# Patient Record
Sex: Male | Born: 1985 | Race: White | Hispanic: No | Marital: Married | Smoking: Never smoker
Health system: Southern US, Community
[De-identification: ages and names within clinical notes are randomized; demographics above are authoritative.]

## PROBLEM LIST (undated history)

## (undated) DIAGNOSIS — E669 Obesity, unspecified: Secondary | ICD-10-CM

## (undated) DIAGNOSIS — J45909 Unspecified asthma, uncomplicated: Secondary | ICD-10-CM

## (undated) DIAGNOSIS — T7840XA Allergy, unspecified, initial encounter: Secondary | ICD-10-CM

## (undated) DIAGNOSIS — G4730 Sleep apnea, unspecified: Secondary | ICD-10-CM

## (undated) DIAGNOSIS — E119 Type 2 diabetes mellitus without complications: Secondary | ICD-10-CM

## (undated) DIAGNOSIS — K209 Esophagitis, unspecified without bleeding: Secondary | ICD-10-CM

## (undated) HISTORY — DX: Obesity, unspecified: E66.9

## (undated) HISTORY — DX: Unspecified asthma, uncomplicated: J45.909

## (undated) HISTORY — DX: Allergy, unspecified, initial encounter: T78.40XA

## (undated) HISTORY — PX: SEPTOPLASTY: SUR1290

## (undated) HISTORY — DX: Sleep apnea, unspecified: G47.30

## (undated) HISTORY — PX: OTHER SURGICAL HISTORY: SHX169

## (undated) HISTORY — DX: Type 2 diabetes mellitus without complications: E11.9

---

## 2010-02-02 ENCOUNTER — Emergency Department (HOSPITAL_COMMUNITY)
Admission: EM | Admit: 2010-02-02 | Discharge: 2010-02-02 | Payer: Self-pay | Source: Home / Self Care | Admitting: Emergency Medicine

## 2010-04-17 LAB — BASIC METABOLIC PANEL
Calcium: 9.5 mg/dL (ref 8.4–10.5)
Creatinine, Ser: 1.07 mg/dL (ref 0.4–1.5)
GFR calc Af Amer: >60 mL/min (ref >60)
Glucose, Bld: 116 mg/dL — ABNORMAL HIGH (ref 70–99)
Sodium: 136 mEq/L (ref 135–145)

## 2010-04-17 LAB — URINALYSIS, ROUTINE W REFLEX MICROSCOPIC: pH: 6.5 (ref 5.0–8.0)

## 2010-04-17 LAB — CBC
HCT: 47.2 % (ref 39.0–52.0)
MCV: 85.4 fL (ref 78.0–100.0)
WBC: 14.3 10*3/uL — ABNORMAL HIGH (ref 4.0–10.5)

## 2010-04-17 LAB — URINE MICROSCOPIC-ADD ON

## 2010-04-17 LAB — DIFFERENTIAL
Basophils Absolute: 0 10*3/uL (ref 0.0–0.1)
Basophils Relative: 0 % (ref 0–1)
Monocytes Absolute: 1 10*3/uL (ref 0.1–1.0)
Monocytes Relative: 7 % (ref 3–12)
Neutro Abs: 12.3 10*3/uL — ABNORMAL HIGH (ref 1.7–7.7)
Neutrophils Relative %: 86 % — ABNORMAL HIGH (ref 43–77)

## 2017-02-13 DIAGNOSIS — J3089 Other allergic rhinitis: Secondary | ICD-10-CM | POA: Diagnosis not present

## 2017-02-13 DIAGNOSIS — R05 Cough: Secondary | ICD-10-CM | POA: Diagnosis not present

## 2017-02-13 DIAGNOSIS — J301 Allergic rhinitis due to pollen: Secondary | ICD-10-CM | POA: Diagnosis not present

## 2017-02-13 DIAGNOSIS — J3081 Allergic rhinitis due to animal (cat) (dog) hair and dander: Secondary | ICD-10-CM | POA: Diagnosis not present

## 2017-02-21 DIAGNOSIS — J301 Allergic rhinitis due to pollen: Secondary | ICD-10-CM | POA: Diagnosis not present

## 2017-02-22 DIAGNOSIS — J3089 Other allergic rhinitis: Secondary | ICD-10-CM | POA: Diagnosis not present

## 2017-02-22 DIAGNOSIS — J3081 Allergic rhinitis due to animal (cat) (dog) hair and dander: Secondary | ICD-10-CM | POA: Diagnosis not present

## 2017-03-12 DIAGNOSIS — J3081 Allergic rhinitis due to animal (cat) (dog) hair and dander: Secondary | ICD-10-CM | POA: Diagnosis not present

## 2017-03-12 DIAGNOSIS — J301 Allergic rhinitis due to pollen: Secondary | ICD-10-CM | POA: Diagnosis not present

## 2017-03-12 DIAGNOSIS — J3089 Other allergic rhinitis: Secondary | ICD-10-CM | POA: Diagnosis not present

## 2017-03-14 DIAGNOSIS — J3089 Other allergic rhinitis: Secondary | ICD-10-CM | POA: Diagnosis not present

## 2017-03-14 DIAGNOSIS — J301 Allergic rhinitis due to pollen: Secondary | ICD-10-CM | POA: Diagnosis not present

## 2017-03-14 DIAGNOSIS — J3081 Allergic rhinitis due to animal (cat) (dog) hair and dander: Secondary | ICD-10-CM | POA: Diagnosis not present

## 2017-03-18 DIAGNOSIS — J3089 Other allergic rhinitis: Secondary | ICD-10-CM | POA: Diagnosis not present

## 2017-03-18 DIAGNOSIS — J301 Allergic rhinitis due to pollen: Secondary | ICD-10-CM | POA: Diagnosis not present

## 2017-03-18 DIAGNOSIS — J3081 Allergic rhinitis due to animal (cat) (dog) hair and dander: Secondary | ICD-10-CM | POA: Diagnosis not present

## 2017-03-29 DIAGNOSIS — J3089 Other allergic rhinitis: Secondary | ICD-10-CM | POA: Diagnosis not present

## 2017-03-29 DIAGNOSIS — J3081 Allergic rhinitis due to animal (cat) (dog) hair and dander: Secondary | ICD-10-CM | POA: Diagnosis not present

## 2017-03-29 DIAGNOSIS — J301 Allergic rhinitis due to pollen: Secondary | ICD-10-CM | POA: Diagnosis not present

## 2017-04-01 DIAGNOSIS — J3081 Allergic rhinitis due to animal (cat) (dog) hair and dander: Secondary | ICD-10-CM | POA: Diagnosis not present

## 2017-04-01 DIAGNOSIS — J301 Allergic rhinitis due to pollen: Secondary | ICD-10-CM | POA: Diagnosis not present

## 2017-04-01 DIAGNOSIS — J3089 Other allergic rhinitis: Secondary | ICD-10-CM | POA: Diagnosis not present

## 2017-04-04 DIAGNOSIS — J3081 Allergic rhinitis due to animal (cat) (dog) hair and dander: Secondary | ICD-10-CM | POA: Diagnosis not present

## 2017-04-04 DIAGNOSIS — J3089 Other allergic rhinitis: Secondary | ICD-10-CM | POA: Diagnosis not present

## 2017-04-04 DIAGNOSIS — J301 Allergic rhinitis due to pollen: Secondary | ICD-10-CM | POA: Diagnosis not present

## 2017-04-09 DIAGNOSIS — J3081 Allergic rhinitis due to animal (cat) (dog) hair and dander: Secondary | ICD-10-CM | POA: Diagnosis not present

## 2017-04-09 DIAGNOSIS — J3089 Other allergic rhinitis: Secondary | ICD-10-CM | POA: Diagnosis not present

## 2017-04-09 DIAGNOSIS — J301 Allergic rhinitis due to pollen: Secondary | ICD-10-CM | POA: Diagnosis not present

## 2017-04-12 DIAGNOSIS — J3089 Other allergic rhinitis: Secondary | ICD-10-CM | POA: Diagnosis not present

## 2017-04-12 DIAGNOSIS — J301 Allergic rhinitis due to pollen: Secondary | ICD-10-CM | POA: Diagnosis not present

## 2017-04-12 DIAGNOSIS — J3081 Allergic rhinitis due to animal (cat) (dog) hair and dander: Secondary | ICD-10-CM | POA: Diagnosis not present

## 2017-04-16 DIAGNOSIS — J3089 Other allergic rhinitis: Secondary | ICD-10-CM | POA: Diagnosis not present

## 2017-04-16 DIAGNOSIS — J301 Allergic rhinitis due to pollen: Secondary | ICD-10-CM | POA: Diagnosis not present

## 2017-04-16 DIAGNOSIS — J3081 Allergic rhinitis due to animal (cat) (dog) hair and dander: Secondary | ICD-10-CM | POA: Diagnosis not present

## 2017-04-18 DIAGNOSIS — J301 Allergic rhinitis due to pollen: Secondary | ICD-10-CM | POA: Diagnosis not present

## 2017-04-18 DIAGNOSIS — J3081 Allergic rhinitis due to animal (cat) (dog) hair and dander: Secondary | ICD-10-CM | POA: Diagnosis not present

## 2017-04-18 DIAGNOSIS — J3089 Other allergic rhinitis: Secondary | ICD-10-CM | POA: Diagnosis not present

## 2017-04-22 DIAGNOSIS — J301 Allergic rhinitis due to pollen: Secondary | ICD-10-CM | POA: Diagnosis not present

## 2017-04-22 DIAGNOSIS — J3089 Other allergic rhinitis: Secondary | ICD-10-CM | POA: Diagnosis not present

## 2017-04-22 DIAGNOSIS — J3081 Allergic rhinitis due to animal (cat) (dog) hair and dander: Secondary | ICD-10-CM | POA: Diagnosis not present

## 2017-04-26 DIAGNOSIS — J3081 Allergic rhinitis due to animal (cat) (dog) hair and dander: Secondary | ICD-10-CM | POA: Diagnosis not present

## 2017-04-26 DIAGNOSIS — J301 Allergic rhinitis due to pollen: Secondary | ICD-10-CM | POA: Diagnosis not present

## 2017-04-26 DIAGNOSIS — J3089 Other allergic rhinitis: Secondary | ICD-10-CM | POA: Diagnosis not present

## 2017-04-29 DIAGNOSIS — J3089 Other allergic rhinitis: Secondary | ICD-10-CM | POA: Diagnosis not present

## 2017-04-29 DIAGNOSIS — J301 Allergic rhinitis due to pollen: Secondary | ICD-10-CM | POA: Diagnosis not present

## 2017-04-29 DIAGNOSIS — J3081 Allergic rhinitis due to animal (cat) (dog) hair and dander: Secondary | ICD-10-CM | POA: Diagnosis not present

## 2017-05-03 DIAGNOSIS — J3081 Allergic rhinitis due to animal (cat) (dog) hair and dander: Secondary | ICD-10-CM | POA: Diagnosis not present

## 2017-05-03 DIAGNOSIS — J3089 Other allergic rhinitis: Secondary | ICD-10-CM | POA: Diagnosis not present

## 2017-05-03 DIAGNOSIS — J301 Allergic rhinitis due to pollen: Secondary | ICD-10-CM | POA: Diagnosis not present

## 2017-05-07 DIAGNOSIS — J3081 Allergic rhinitis due to animal (cat) (dog) hair and dander: Secondary | ICD-10-CM | POA: Diagnosis not present

## 2017-05-07 DIAGNOSIS — J3089 Other allergic rhinitis: Secondary | ICD-10-CM | POA: Diagnosis not present

## 2017-05-07 DIAGNOSIS — J301 Allergic rhinitis due to pollen: Secondary | ICD-10-CM | POA: Diagnosis not present

## 2017-05-10 DIAGNOSIS — J3081 Allergic rhinitis due to animal (cat) (dog) hair and dander: Secondary | ICD-10-CM | POA: Diagnosis not present

## 2017-05-10 DIAGNOSIS — J301 Allergic rhinitis due to pollen: Secondary | ICD-10-CM | POA: Diagnosis not present

## 2017-05-10 DIAGNOSIS — J3089 Other allergic rhinitis: Secondary | ICD-10-CM | POA: Diagnosis not present

## 2017-05-13 DIAGNOSIS — J301 Allergic rhinitis due to pollen: Secondary | ICD-10-CM | POA: Diagnosis not present

## 2017-05-13 DIAGNOSIS — J3081 Allergic rhinitis due to animal (cat) (dog) hair and dander: Secondary | ICD-10-CM | POA: Diagnosis not present

## 2017-05-13 DIAGNOSIS — J3089 Other allergic rhinitis: Secondary | ICD-10-CM | POA: Diagnosis not present

## 2017-05-17 DIAGNOSIS — J3081 Allergic rhinitis due to animal (cat) (dog) hair and dander: Secondary | ICD-10-CM | POA: Diagnosis not present

## 2017-05-17 DIAGNOSIS — J3089 Other allergic rhinitis: Secondary | ICD-10-CM | POA: Diagnosis not present

## 2017-05-17 DIAGNOSIS — J301 Allergic rhinitis due to pollen: Secondary | ICD-10-CM | POA: Diagnosis not present

## 2017-05-20 DIAGNOSIS — J3081 Allergic rhinitis due to animal (cat) (dog) hair and dander: Secondary | ICD-10-CM | POA: Diagnosis not present

## 2017-05-20 DIAGNOSIS — J301 Allergic rhinitis due to pollen: Secondary | ICD-10-CM | POA: Diagnosis not present

## 2017-05-20 DIAGNOSIS — J3089 Other allergic rhinitis: Secondary | ICD-10-CM | POA: Diagnosis not present

## 2017-05-22 DIAGNOSIS — J3081 Allergic rhinitis due to animal (cat) (dog) hair and dander: Secondary | ICD-10-CM | POA: Diagnosis not present

## 2017-05-22 DIAGNOSIS — J301 Allergic rhinitis due to pollen: Secondary | ICD-10-CM | POA: Diagnosis not present

## 2017-05-22 DIAGNOSIS — J3089 Other allergic rhinitis: Secondary | ICD-10-CM | POA: Diagnosis not present

## 2017-05-27 DIAGNOSIS — J3081 Allergic rhinitis due to animal (cat) (dog) hair and dander: Secondary | ICD-10-CM | POA: Diagnosis not present

## 2017-05-27 DIAGNOSIS — J301 Allergic rhinitis due to pollen: Secondary | ICD-10-CM | POA: Diagnosis not present

## 2017-05-27 DIAGNOSIS — J3089 Other allergic rhinitis: Secondary | ICD-10-CM | POA: Diagnosis not present

## 2017-05-31 DIAGNOSIS — J301 Allergic rhinitis due to pollen: Secondary | ICD-10-CM | POA: Diagnosis not present

## 2017-05-31 DIAGNOSIS — J3081 Allergic rhinitis due to animal (cat) (dog) hair and dander: Secondary | ICD-10-CM | POA: Diagnosis not present

## 2017-05-31 DIAGNOSIS — J3089 Other allergic rhinitis: Secondary | ICD-10-CM | POA: Diagnosis not present

## 2017-06-03 DIAGNOSIS — J301 Allergic rhinitis due to pollen: Secondary | ICD-10-CM | POA: Diagnosis not present

## 2017-06-03 DIAGNOSIS — J3089 Other allergic rhinitis: Secondary | ICD-10-CM | POA: Diagnosis not present

## 2017-06-03 DIAGNOSIS — J3081 Allergic rhinitis due to animal (cat) (dog) hair and dander: Secondary | ICD-10-CM | POA: Diagnosis not present

## 2017-06-06 DIAGNOSIS — J3081 Allergic rhinitis due to animal (cat) (dog) hair and dander: Secondary | ICD-10-CM | POA: Diagnosis not present

## 2017-06-06 DIAGNOSIS — J3089 Other allergic rhinitis: Secondary | ICD-10-CM | POA: Diagnosis not present

## 2017-06-06 DIAGNOSIS — J301 Allergic rhinitis due to pollen: Secondary | ICD-10-CM | POA: Diagnosis not present

## 2017-07-16 DIAGNOSIS — J3081 Allergic rhinitis due to animal (cat) (dog) hair and dander: Secondary | ICD-10-CM | POA: Diagnosis not present

## 2017-07-16 DIAGNOSIS — J3089 Other allergic rhinitis: Secondary | ICD-10-CM | POA: Diagnosis not present

## 2017-07-16 DIAGNOSIS — H1045 Other chronic allergic conjunctivitis: Secondary | ICD-10-CM | POA: Diagnosis not present

## 2017-07-16 DIAGNOSIS — J301 Allergic rhinitis due to pollen: Secondary | ICD-10-CM | POA: Diagnosis not present

## 2017-07-17 DIAGNOSIS — J3081 Allergic rhinitis due to animal (cat) (dog) hair and dander: Secondary | ICD-10-CM | POA: Diagnosis not present

## 2017-07-17 DIAGNOSIS — J3089 Other allergic rhinitis: Secondary | ICD-10-CM | POA: Diagnosis not present

## 2017-12-17 DIAGNOSIS — R0681 Apnea, not elsewhere classified: Secondary | ICD-10-CM | POA: Diagnosis not present

## 2017-12-17 DIAGNOSIS — J309 Allergic rhinitis, unspecified: Secondary | ICD-10-CM | POA: Diagnosis not present

## 2017-12-17 DIAGNOSIS — R1310 Dysphagia, unspecified: Secondary | ICD-10-CM | POA: Diagnosis not present

## 2017-12-27 DIAGNOSIS — R1310 Dysphagia, unspecified: Secondary | ICD-10-CM | POA: Diagnosis not present

## 2017-12-27 DIAGNOSIS — E669 Obesity, unspecified: Secondary | ICD-10-CM | POA: Diagnosis not present

## 2018-01-14 DIAGNOSIS — J310 Chronic rhinitis: Secondary | ICD-10-CM | POA: Diagnosis not present

## 2018-01-14 DIAGNOSIS — J343 Hypertrophy of nasal turbinates: Secondary | ICD-10-CM | POA: Diagnosis not present

## 2018-01-14 DIAGNOSIS — J342 Deviated nasal septum: Secondary | ICD-10-CM | POA: Diagnosis not present

## 2018-01-22 DIAGNOSIS — J3081 Allergic rhinitis due to animal (cat) (dog) hair and dander: Secondary | ICD-10-CM | POA: Diagnosis not present

## 2018-01-22 DIAGNOSIS — J301 Allergic rhinitis due to pollen: Secondary | ICD-10-CM | POA: Diagnosis not present

## 2018-01-22 DIAGNOSIS — J3089 Other allergic rhinitis: Secondary | ICD-10-CM | POA: Diagnosis not present

## 2018-01-22 DIAGNOSIS — H1045 Other chronic allergic conjunctivitis: Secondary | ICD-10-CM | POA: Diagnosis not present

## 2018-01-23 DIAGNOSIS — J3089 Other allergic rhinitis: Secondary | ICD-10-CM | POA: Diagnosis not present

## 2018-01-23 DIAGNOSIS — J3081 Allergic rhinitis due to animal (cat) (dog) hair and dander: Secondary | ICD-10-CM | POA: Diagnosis not present

## 2018-02-13 DIAGNOSIS — K222 Esophageal obstruction: Secondary | ICD-10-CM | POA: Diagnosis not present

## 2018-02-13 DIAGNOSIS — R1310 Dysphagia, unspecified: Secondary | ICD-10-CM | POA: Diagnosis not present

## 2018-02-13 DIAGNOSIS — R1314 Dysphagia, pharyngoesophageal phase: Secondary | ICD-10-CM | POA: Diagnosis not present

## 2018-02-13 DIAGNOSIS — K200 Eosinophilic esophagitis: Secondary | ICD-10-CM | POA: Diagnosis not present

## 2018-02-13 DIAGNOSIS — K2930 Chronic superficial gastritis without bleeding: Secondary | ICD-10-CM | POA: Diagnosis not present

## 2018-02-19 DIAGNOSIS — K2930 Chronic superficial gastritis without bleeding: Secondary | ICD-10-CM | POA: Diagnosis not present

## 2018-02-19 DIAGNOSIS — K200 Eosinophilic esophagitis: Secondary | ICD-10-CM | POA: Diagnosis not present

## 2018-02-26 DIAGNOSIS — J343 Hypertrophy of nasal turbinates: Secondary | ICD-10-CM | POA: Diagnosis not present

## 2018-02-26 DIAGNOSIS — J342 Deviated nasal septum: Secondary | ICD-10-CM | POA: Diagnosis not present

## 2018-02-26 DIAGNOSIS — J310 Chronic rhinitis: Secondary | ICD-10-CM | POA: Diagnosis not present

## 2018-03-04 DIAGNOSIS — J309 Allergic rhinitis, unspecified: Secondary | ICD-10-CM | POA: Diagnosis not present

## 2018-03-04 DIAGNOSIS — J342 Deviated nasal septum: Secondary | ICD-10-CM | POA: Diagnosis not present

## 2018-03-04 DIAGNOSIS — G4730 Sleep apnea, unspecified: Secondary | ICD-10-CM | POA: Diagnosis not present

## 2018-03-04 DIAGNOSIS — J343 Hypertrophy of nasal turbinates: Secondary | ICD-10-CM | POA: Diagnosis not present

## 2018-03-07 DIAGNOSIS — J3489 Other specified disorders of nose and nasal sinuses: Secondary | ICD-10-CM | POA: Diagnosis not present

## 2018-03-07 DIAGNOSIS — J342 Deviated nasal septum: Secondary | ICD-10-CM | POA: Diagnosis not present

## 2018-03-07 DIAGNOSIS — J343 Hypertrophy of nasal turbinates: Secondary | ICD-10-CM | POA: Diagnosis not present

## 2018-05-22 DIAGNOSIS — K200 Eosinophilic esophagitis: Secondary | ICD-10-CM | POA: Diagnosis not present

## 2018-05-22 DIAGNOSIS — R1310 Dysphagia, unspecified: Secondary | ICD-10-CM | POA: Diagnosis not present

## 2018-10-20 ENCOUNTER — Emergency Department (HOSPITAL_COMMUNITY)
Admission: EM | Admit: 2018-10-20 | Discharge: 2018-10-20 | Disposition: A | Discharge status: Home / Self Care | Payer: BC Managed Care – PPO | Attending: Emergency Medicine | Admitting: Emergency Medicine

## 2018-10-20 ENCOUNTER — Encounter (HOSPITAL_COMMUNITY): Payer: Self-pay | Admitting: Emergency Medicine

## 2018-10-20 ENCOUNTER — Other Ambulatory Visit: Payer: Self-pay

## 2018-10-20 ENCOUNTER — Emergency Department (HOSPITAL_COMMUNITY): Payer: BC Managed Care – PPO

## 2018-10-20 DIAGNOSIS — R0789 Other chest pain: Secondary | ICD-10-CM | POA: Diagnosis not present

## 2018-10-20 DIAGNOSIS — R079 Chest pain, unspecified: Secondary | ICD-10-CM | POA: Diagnosis not present

## 2018-10-20 HISTORY — DX: Esophagitis, unspecified without bleeding: K20.90

## 2018-10-20 LAB — CBC
HCT: 46.9 % (ref 39.0–52.0)
Hemoglobin: 15.7 g/dL (ref 13.0–17.0)
MCH: 29 pg (ref 26.0–34.0)
MCHC: 33.5 g/dL (ref 30.0–36.0)
MCV: 86.7 fL (ref 80.0–100.0)
Platelets: 200 10*3/uL (ref 150–400)
RBC: 5.41 MIL/uL (ref 4.22–5.81)
RDW: 13.4 % (ref 11.5–15.5)
WBC: 10.4 10*3/uL (ref 4.0–10.5)
nRBC: 0 % (ref 0.0–0.2)

## 2018-10-20 LAB — BASIC METABOLIC PANEL
Anion gap: 9 (ref 5–15)
BUN: 12 mg/dL (ref 6–20)
CO2: 23 mmol/L (ref 22–32)
Calcium: 9.5 mg/dL (ref 8.9–10.3)
Chloride: 107 mmol/L (ref 98–111)
Creatinine, Ser: 0.93 mg/dL (ref 0.61–1.24)
GFR calc Af Amer: >60 mL/min (ref >60)
GFR calc non Af Amer: >60 mL/min (ref >60)
Glucose, Bld: 83 mg/dL (ref 70–99)
Potassium: 4.4 mmol/L (ref 3.5–5.1)
Sodium: 139 mmol/L (ref 135–145)

## 2018-10-20 LAB — D-DIMER, QUANTITATIVE: D-Dimer, Quant: <0.27 ug/mL-FEU (ref 0.00–0.50)

## 2018-10-20 LAB — TROPONIN I (HIGH SENSITIVITY): Troponin I (High Sensitivity): 2 ng/L (ref <18)

## 2018-10-20 MED ORDER — SODIUM CHLORIDE 0.9% FLUSH: 3.0000 mL | Freq: Once | INTRAVENOUS | Status: DC

## 2018-10-20 NOTE — ED Triage Notes (Signed)
Patient c/o mid chest pain x a few seconds approx one hour ago. Patient states he feels like he can not get a deep breath since having the chest pain. Patient states he was just sitting at his desk working.

## 2018-10-20 NOTE — Discharge Instructions (Signed)
Return to the ED if you start to have worsening symptoms, worsening chest pain, leg swelling, shortness of breath, vomiting or coughing up blood.

## 2018-10-20 NOTE — ED Provider Notes (Signed)
[facility]-EMERGENCY DEPT Provider Note   CSN: [id number] Arrival date & time: 10/20/18  1335     History   Chief Complaint Chief Complaint  Patient presents with   Chest Pain    HPI Jared Blake is a 33 y.o. male with past medical history of esophagitis presents to ED for chest pressure.  States that he was sitting at his desk at work earlier today when he had acute onset of pain to the center of his chest.  States that "it felt like I got the wind knocked out of me and someone punched me."  States that the episode lasted for several seconds and resolved on its own.  States he no longer has the pain but feels like "the pressure gets worse when I taken a deep breath."  No history of similar symptoms in the past.  Denies any cough, shortness of breath, fever, hemoptysis, leg swelling.  He denies any family history of heart disease at a young age, history of DVT, PE, MI, recent immobilization, abdominal pain, vomiting, changes to activity or appetite, injury or fall or URI symptoms.     HPI  Past Medical History:  Diagnosis Date   Esophagitis     There are no active problems to display for this patient.   History reviewed. No pertinent surgical history.      Home Medications    Prior to Admission medications   Medication Sig Start Date End Date Taking? Authorizing Provider  cetirizine (ZYRTEC) 10 MG tablet Take 10 mg by mouth daily.   Yes [provider]    Family History No family history on file.  Social History Social History   Tobacco Use   Smoking status: Never Smoker   Smokeless tobacco: Never Used  Substance Use Topics   Alcohol use: Yes    Comment: social   Drug use: Never     Allergies   Patient has no known allergies.   Review of Systems Review of Systems  Constitutional: Negative for appetite change, chills and fever.  HENT: Negative for ear pain, rhinorrhea, sneezing and sore throat.   Eyes: Negative  for photophobia and visual disturbance.  Respiratory: Negative for cough, chest tightness, shortness of breath and wheezing.   Cardiovascular: Positive for chest pain. Negative for palpitations.  Gastrointestinal: Negative for abdominal pain, blood in stool, constipation, diarrhea, nausea and vomiting.  Genitourinary: Negative for dysuria, hematuria and urgency.  Musculoskeletal: Negative for myalgias.  Skin: Negative for rash.  Neurological: Negative for dizziness, weakness and light-headedness.     Physical Exam Updated Vital Signs BP 131/73    Pulse 66    Temp 98.5 F (36.9 C) (Oral)    Resp 17    Ht 6\' 3"  (1.905 m)    Wt 129.3 kg    SpO2 100%    BMI 35.62 kg/m   Physical Exam Vitals signs and nursing note reviewed.  Constitutional:      General: He is not in acute distress.    Appearance: He is well-developed. He is obese.     Comments: Nontoxic-appearing in no acute distress. Speaking in complete sentences without difficulty.  HENT:     Head: Normocephalic and atraumatic.     Nose: Nose normal.  Eyes:     General: No scleral icterus.       Right eye: No discharge.        Left eye: No discharge.     Conjunctiva/sclera: Conjunctivae normal.  Neck:  Musculoskeletal: Normal range of motion and neck supple.  Cardiovascular:     Rate and Rhythm: Normal rate and regular rhythm.     Heart sounds: Normal heart sounds. No murmur. No friction rub. No gallop.   Pulmonary:     Effort: Pulmonary effort is normal. No respiratory distress.     Breath sounds: Normal breath sounds.  Abdominal:     General: Bowel sounds are normal. There is no distension.     Palpations: Abdomen is soft.     Tenderness: There is no abdominal tenderness. There is no guarding.  Musculoskeletal: Normal range of motion.  Skin:    General: Skin is warm and dry.     Findings: No rash.     Comments: No lower extremity edema, erythema or calf tenderness bilaterally.  Neurological:     Mental Status: He  is alert.     Motor: No abnormal muscle tone.     Coordination: Coordination normal.      ED Treatments / Results  Labs (all labs ordered are listed, but only abnormal results are displayed) Labs Reviewed  BASIC METABOLIC PANEL  CBC  D-DIMER, QUANTITATIVE (NOT AT [facility])  TROPONIN I (HIGH SENSITIVITY)    EKG None  Radiology Dg Chest 2 View  Result Date: 10/20/2018 CLINICAL DATA:  Chest pain EXAM: CHEST - 2 VIEW COMPARISON:  None. FINDINGS: Lungs are clear. Heart size and pulmonary vascularity are normal. No adenopathy. No pneumothorax. No bone lesions. IMPRESSION: No edema or consolidation. Electronically Signed   By: Bretta Bang III M.D.   On: 10/20/2018 14:29    Procedures Procedures (including critical care time)  Medications Ordered in ED Medications  sodium chloride flush (NS) 0.9 % injection 3 mL (has no administration in time range)     Initial Impression / Assessment and Plan / ED Course  I have reviewed the triage vital signs and the nursing notes.  Pertinent labs & imaging results that were available during my care of the patient were reviewed by me and considered in my medical decision making (see chart for details).        Jared Blake was evaluated in Emergency Department on 10/20/2018 for the symptoms described in the history of present illness. He was evaluated in the context of the global COVID-19 pandemic, which necessitated consideration that the patient might be at risk for infection with the SARS-CoV-2 virus that causes COVID-19. Institutional protocols and algorithms that pertain to the evaluation of patients at risk for COVID-19 are in a state of rapid change based on information released by regulatory bodies including the CDC and federal and state organizations. These policies and algorithms were followed during the patient's care in the ED.  33 year old male presents to ED for brief history of chest pressure that occurred prior to arrival  lasted several seconds and has since resolved completely.  Symptoms began while he was sitting in his chair at work.  Now reports somewhat pressure sensation that is worse with deep breathing.  No lower extremity edema, erythema or calf tenderness or concerning for DVT.  He is overall well-appearing on physical exam.  Speaking complete sentence without difficulty, no signs of respiratory distress.  Vital signs are within normal limits.  Troponin is negative, EKG shows normal sinus rhythm.  D-dimer is negative which was obtained due to his pleuritic chest pressure.  CBC, BMP are unremarkable.  Chest x-ray is unremarkable.  Patient remains asymptomatic here.  He is low risk by heart score, low  suspicion for PE as his d-dimer is negative.  Feel that one negative troponin is reasonable based on his heart score.  He is comfortable with discharge home.  Suspect that symptoms could be due to his history of esophagitis.  We will have him follow-up with PCP and return for worsening symptoms.  Patient is hemodynamically stable, in NAD, and able to ambulate in the ED. Evaluation does not show pathology that would require ongoing emergent intervention or inpatient treatment. I explained the diagnosis to the patient. Pain has been managed and has no complaints prior to discharge. Patient is comfortable with above plan and is stable for discharge at this time. All questions were answered prior to disposition. Strict return precautions for returning to the ED were discussed. Encouraged follow up with PCP.   An After Visit Summary was printed and given to the patient.   Portions of this note were generated with [employer]. Dictation errors may occur despite best attempts at proofreading.   Final Clinical Impressions(s) / ED Diagnoses   Final diagnoses:  Chest wall pain   .hkdr ED Discharge Orders    None       Delia Heady, [place] 10/20/18 2020    Charlesetta Shanks, MD 10/23/18 [phone]

## 2018-12-09 DIAGNOSIS — B354 Tinea corporis: Secondary | ICD-10-CM | POA: Diagnosis not present

## 2018-12-09 DIAGNOSIS — L249 Irritant contact dermatitis, unspecified cause: Secondary | ICD-10-CM | POA: Diagnosis not present

## 2019-04-03 DIAGNOSIS — R110 Nausea: Secondary | ICD-10-CM | POA: Diagnosis not present

## 2019-04-03 DIAGNOSIS — R1013 Epigastric pain: Secondary | ICD-10-CM | POA: Diagnosis not present

## 2019-04-03 DIAGNOSIS — K2970 Gastritis, unspecified, without bleeding: Secondary | ICD-10-CM | POA: Diagnosis not present

## 2019-04-03 DIAGNOSIS — K200 Eosinophilic esophagitis: Secondary | ICD-10-CM | POA: Diagnosis not present

## 2019-04-06 ENCOUNTER — Other Ambulatory Visit: Payer: Self-pay | Admitting: Physician Assistant

## 2019-04-06 DIAGNOSIS — R1013 Epigastric pain: Secondary | ICD-10-CM

## 2019-04-07 DIAGNOSIS — R1013 Epigastric pain: Secondary | ICD-10-CM | POA: Diagnosis not present

## 2019-04-10 ENCOUNTER — Ambulatory Visit
Admission: RE | Admit: 2019-04-10 | Discharge: 2019-04-10 | Disposition: A | Discharge status: Home / Self Care | Payer: BC Managed Care – PPO | Source: Ambulatory Visit | Attending: Physician Assistant | Admitting: Physician Assistant

## 2019-04-10 DIAGNOSIS — R1013 Epigastric pain: Secondary | ICD-10-CM

## 2019-04-15 ENCOUNTER — Other Ambulatory Visit: Payer: Self-pay | Admitting: Physician Assistant

## 2019-04-15 DIAGNOSIS — R9389 Abnormal findings on diagnostic imaging of other specified body structures: Secondary | ICD-10-CM

## 2019-04-15 DIAGNOSIS — R1011 Right upper quadrant pain: Secondary | ICD-10-CM

## 2019-04-15 DIAGNOSIS — R110 Nausea: Secondary | ICD-10-CM

## 2019-04-21 ENCOUNTER — Other Ambulatory Visit: Payer: Self-pay

## 2019-04-21 ENCOUNTER — Ambulatory Visit
Admission: RE | Admit: 2019-04-21 | Discharge: 2019-04-21 | Disposition: A | Discharge status: Home / Self Care | Payer: BC Managed Care – PPO | Source: Ambulatory Visit | Attending: Physician Assistant | Admitting: Physician Assistant

## 2019-04-21 DIAGNOSIS — K760 Fatty (change of) liver, not elsewhere classified: Secondary | ICD-10-CM | POA: Diagnosis not present

## 2019-04-21 DIAGNOSIS — R110 Nausea: Secondary | ICD-10-CM

## 2019-04-21 DIAGNOSIS — R1011 Right upper quadrant pain: Secondary | ICD-10-CM

## 2019-04-21 DIAGNOSIS — R161 Splenomegaly, not elsewhere classified: Secondary | ICD-10-CM | POA: Diagnosis not present

## 2019-04-21 DIAGNOSIS — R9389 Abnormal findings on diagnostic imaging of other specified body structures: Secondary | ICD-10-CM

## 2019-04-21 MED ORDER — IOPAMIDOL (ISOVUE-300) INJECTION 61%
125.0000 mL | Freq: Once | INTRAVENOUS | Status: AC | PRN
Start: 2019-04-21 — End: 2019-04-21
  Administered 2019-04-21: 09:00:00 125 mL via INTRAVENOUS

## 2019-04-28 ENCOUNTER — Other Ambulatory Visit: Payer: Self-pay | Admitting: Gastroenterology

## 2019-04-28 ENCOUNTER — Other Ambulatory Visit (HOSPITAL_COMMUNITY): Payer: Self-pay | Admitting: Gastroenterology

## 2019-04-28 DIAGNOSIS — R110 Nausea: Secondary | ICD-10-CM | POA: Diagnosis not present

## 2019-04-28 DIAGNOSIS — R1013 Epigastric pain: Secondary | ICD-10-CM | POA: Diagnosis not present

## 2019-04-28 DIAGNOSIS — K200 Eosinophilic esophagitis: Secondary | ICD-10-CM | POA: Diagnosis not present

## 2019-05-18 ENCOUNTER — Other Ambulatory Visit: Payer: Self-pay

## 2019-05-18 ENCOUNTER — Encounter (HOSPITAL_COMMUNITY)
Admission: RE | Admit: 2019-05-18 | Discharge: 2019-05-18 | Disposition: A | Discharge status: Home / Self Care | Payer: BC Managed Care – PPO | Source: Ambulatory Visit | Attending: Gastroenterology | Admitting: Gastroenterology

## 2019-05-18 DIAGNOSIS — R1013 Epigastric pain: Secondary | ICD-10-CM | POA: Diagnosis not present

## 2019-05-18 DIAGNOSIS — R110 Nausea: Secondary | ICD-10-CM | POA: Insufficient documentation

## 2019-05-18 MED ORDER — TECHNETIUM TC 99M MEBROFENIN IV KIT
5.2000 | PACK | Freq: Once | INTRAVENOUS | Status: AC | PRN
  Administered 2019-05-18: 5.2 via INTRAVENOUS

## 2019-05-27 DIAGNOSIS — R948 Abnormal results of function studies of other organs and systems: Secondary | ICD-10-CM | POA: Diagnosis not present

## 2019-05-27 DIAGNOSIS — K200 Eosinophilic esophagitis: Secondary | ICD-10-CM | POA: Diagnosis not present

## 2019-05-27 DIAGNOSIS — R110 Nausea: Secondary | ICD-10-CM | POA: Diagnosis not present

## 2019-06-05 DIAGNOSIS — J3081 Allergic rhinitis due to animal (cat) (dog) hair and dander: Secondary | ICD-10-CM | POA: Diagnosis not present

## 2019-06-05 DIAGNOSIS — J3089 Other allergic rhinitis: Secondary | ICD-10-CM | POA: Diagnosis not present

## 2019-06-05 DIAGNOSIS — J301 Allergic rhinitis due to pollen: Secondary | ICD-10-CM | POA: Diagnosis not present

## 2019-06-08 DIAGNOSIS — L301 Dyshidrosis [pompholyx]: Secondary | ICD-10-CM | POA: Diagnosis not present

## 2019-06-08 DIAGNOSIS — L300 Nummular dermatitis: Secondary | ICD-10-CM | POA: Diagnosis not present

## 2019-06-12 DIAGNOSIS — J301 Allergic rhinitis due to pollen: Secondary | ICD-10-CM | POA: Diagnosis not present

## 2019-06-15 DIAGNOSIS — J3089 Other allergic rhinitis: Secondary | ICD-10-CM | POA: Diagnosis not present

## 2019-06-15 DIAGNOSIS — J3081 Allergic rhinitis due to animal (cat) (dog) hair and dander: Secondary | ICD-10-CM | POA: Diagnosis not present

## 2019-06-15 DIAGNOSIS — K828 Other specified diseases of gallbladder: Secondary | ICD-10-CM | POA: Diagnosis not present

## 2019-06-23 DIAGNOSIS — J3081 Allergic rhinitis due to animal (cat) (dog) hair and dander: Secondary | ICD-10-CM | POA: Diagnosis not present

## 2019-06-23 DIAGNOSIS — J3089 Other allergic rhinitis: Secondary | ICD-10-CM | POA: Diagnosis not present

## 2019-06-23 DIAGNOSIS — J301 Allergic rhinitis due to pollen: Secondary | ICD-10-CM | POA: Diagnosis not present

## 2019-06-29 DIAGNOSIS — J301 Allergic rhinitis due to pollen: Secondary | ICD-10-CM | POA: Diagnosis not present

## 2019-06-29 DIAGNOSIS — J3081 Allergic rhinitis due to animal (cat) (dog) hair and dander: Secondary | ICD-10-CM | POA: Diagnosis not present

## 2019-06-29 DIAGNOSIS — J3089 Other allergic rhinitis: Secondary | ICD-10-CM | POA: Diagnosis not present

## 2019-07-02 DIAGNOSIS — Z13220 Encounter for screening for lipoid disorders: Secondary | ICD-10-CM | POA: Diagnosis not present

## 2019-07-02 DIAGNOSIS — R7309 Other abnormal glucose: Secondary | ICD-10-CM | POA: Diagnosis not present

## 2019-07-02 DIAGNOSIS — Z0000 Encounter for general adult medical examination without abnormal findings: Secondary | ICD-10-CM | POA: Diagnosis not present

## 2019-07-03 DIAGNOSIS — J3089 Other allergic rhinitis: Secondary | ICD-10-CM | POA: Diagnosis not present

## 2019-07-03 DIAGNOSIS — J301 Allergic rhinitis due to pollen: Secondary | ICD-10-CM | POA: Diagnosis not present

## 2019-07-03 DIAGNOSIS — J3081 Allergic rhinitis due to animal (cat) (dog) hair and dander: Secondary | ICD-10-CM | POA: Diagnosis not present

## 2019-07-07 DIAGNOSIS — J301 Allergic rhinitis due to pollen: Secondary | ICD-10-CM | POA: Diagnosis not present

## 2019-07-07 DIAGNOSIS — J3081 Allergic rhinitis due to animal (cat) (dog) hair and dander: Secondary | ICD-10-CM | POA: Diagnosis not present

## 2019-07-07 DIAGNOSIS — J3089 Other allergic rhinitis: Secondary | ICD-10-CM | POA: Diagnosis not present

## 2019-07-10 DIAGNOSIS — J3081 Allergic rhinitis due to animal (cat) (dog) hair and dander: Secondary | ICD-10-CM | POA: Diagnosis not present

## 2019-07-10 DIAGNOSIS — J301 Allergic rhinitis due to pollen: Secondary | ICD-10-CM | POA: Diagnosis not present

## 2019-07-10 DIAGNOSIS — J3089 Other allergic rhinitis: Secondary | ICD-10-CM | POA: Diagnosis not present

## 2019-07-13 DIAGNOSIS — J3081 Allergic rhinitis due to animal (cat) (dog) hair and dander: Secondary | ICD-10-CM | POA: Diagnosis not present

## 2019-07-13 DIAGNOSIS — J301 Allergic rhinitis due to pollen: Secondary | ICD-10-CM | POA: Diagnosis not present

## 2019-07-13 DIAGNOSIS — J3089 Other allergic rhinitis: Secondary | ICD-10-CM | POA: Diagnosis not present

## 2019-07-17 DIAGNOSIS — J301 Allergic rhinitis due to pollen: Secondary | ICD-10-CM | POA: Diagnosis not present

## 2019-07-17 DIAGNOSIS — J3081 Allergic rhinitis due to animal (cat) (dog) hair and dander: Secondary | ICD-10-CM | POA: Diagnosis not present

## 2019-07-17 DIAGNOSIS — J3089 Other allergic rhinitis: Secondary | ICD-10-CM | POA: Diagnosis not present

## 2019-07-20 DIAGNOSIS — J3081 Allergic rhinitis due to animal (cat) (dog) hair and dander: Secondary | ICD-10-CM | POA: Diagnosis not present

## 2019-07-20 DIAGNOSIS — J301 Allergic rhinitis due to pollen: Secondary | ICD-10-CM | POA: Diagnosis not present

## 2019-07-20 DIAGNOSIS — J3089 Other allergic rhinitis: Secondary | ICD-10-CM | POA: Diagnosis not present

## 2019-07-24 DIAGNOSIS — J3089 Other allergic rhinitis: Secondary | ICD-10-CM | POA: Diagnosis not present

## 2019-07-24 DIAGNOSIS — J301 Allergic rhinitis due to pollen: Secondary | ICD-10-CM | POA: Diagnosis not present

## 2019-07-24 DIAGNOSIS — J3081 Allergic rhinitis due to animal (cat) (dog) hair and dander: Secondary | ICD-10-CM | POA: Diagnosis not present

## 2019-07-28 DIAGNOSIS — J3081 Allergic rhinitis due to animal (cat) (dog) hair and dander: Secondary | ICD-10-CM | POA: Diagnosis not present

## 2019-07-28 DIAGNOSIS — J301 Allergic rhinitis due to pollen: Secondary | ICD-10-CM | POA: Diagnosis not present

## 2019-07-28 DIAGNOSIS — J3089 Other allergic rhinitis: Secondary | ICD-10-CM | POA: Diagnosis not present

## 2019-07-31 DIAGNOSIS — J3089 Other allergic rhinitis: Secondary | ICD-10-CM | POA: Diagnosis not present

## 2019-07-31 DIAGNOSIS — J301 Allergic rhinitis due to pollen: Secondary | ICD-10-CM | POA: Diagnosis not present

## 2019-07-31 DIAGNOSIS — J3081 Allergic rhinitis due to animal (cat) (dog) hair and dander: Secondary | ICD-10-CM | POA: Diagnosis not present

## 2019-08-04 DIAGNOSIS — J301 Allergic rhinitis due to pollen: Secondary | ICD-10-CM | POA: Diagnosis not present

## 2019-08-04 DIAGNOSIS — J3081 Allergic rhinitis due to animal (cat) (dog) hair and dander: Secondary | ICD-10-CM | POA: Diagnosis not present

## 2019-08-04 DIAGNOSIS — J3089 Other allergic rhinitis: Secondary | ICD-10-CM | POA: Diagnosis not present

## 2019-08-07 DIAGNOSIS — J3081 Allergic rhinitis due to animal (cat) (dog) hair and dander: Secondary | ICD-10-CM | POA: Diagnosis not present

## 2019-08-07 DIAGNOSIS — J301 Allergic rhinitis due to pollen: Secondary | ICD-10-CM | POA: Diagnosis not present

## 2019-08-07 DIAGNOSIS — J3089 Other allergic rhinitis: Secondary | ICD-10-CM | POA: Diagnosis not present

## 2019-08-12 DIAGNOSIS — J3089 Other allergic rhinitis: Secondary | ICD-10-CM | POA: Diagnosis not present

## 2019-08-12 DIAGNOSIS — J301 Allergic rhinitis due to pollen: Secondary | ICD-10-CM | POA: Diagnosis not present

## 2019-08-12 DIAGNOSIS — J3081 Allergic rhinitis due to animal (cat) (dog) hair and dander: Secondary | ICD-10-CM | POA: Diagnosis not present

## 2019-08-14 DIAGNOSIS — J301 Allergic rhinitis due to pollen: Secondary | ICD-10-CM | POA: Diagnosis not present

## 2019-08-14 DIAGNOSIS — J3081 Allergic rhinitis due to animal (cat) (dog) hair and dander: Secondary | ICD-10-CM | POA: Diagnosis not present

## 2019-08-14 DIAGNOSIS — J3089 Other allergic rhinitis: Secondary | ICD-10-CM | POA: Diagnosis not present

## 2019-08-18 DIAGNOSIS — J301 Allergic rhinitis due to pollen: Secondary | ICD-10-CM | POA: Diagnosis not present

## 2019-08-18 DIAGNOSIS — J3089 Other allergic rhinitis: Secondary | ICD-10-CM | POA: Diagnosis not present

## 2019-08-18 DIAGNOSIS — J3081 Allergic rhinitis due to animal (cat) (dog) hair and dander: Secondary | ICD-10-CM | POA: Diagnosis not present

## 2019-08-20 DIAGNOSIS — J3081 Allergic rhinitis due to animal (cat) (dog) hair and dander: Secondary | ICD-10-CM | POA: Diagnosis not present

## 2019-08-20 DIAGNOSIS — J301 Allergic rhinitis due to pollen: Secondary | ICD-10-CM | POA: Diagnosis not present

## 2019-08-20 DIAGNOSIS — J3089 Other allergic rhinitis: Secondary | ICD-10-CM | POA: Diagnosis not present

## 2019-08-25 DIAGNOSIS — J3081 Allergic rhinitis due to animal (cat) (dog) hair and dander: Secondary | ICD-10-CM | POA: Diagnosis not present

## 2019-08-25 DIAGNOSIS — J301 Allergic rhinitis due to pollen: Secondary | ICD-10-CM | POA: Diagnosis not present

## 2019-08-25 DIAGNOSIS — J3089 Other allergic rhinitis: Secondary | ICD-10-CM | POA: Diagnosis not present

## 2019-08-26 DIAGNOSIS — B070 Plantar wart: Secondary | ICD-10-CM | POA: Diagnosis not present

## 2019-08-28 DIAGNOSIS — J301 Allergic rhinitis due to pollen: Secondary | ICD-10-CM | POA: Diagnosis not present

## 2019-08-28 DIAGNOSIS — J3089 Other allergic rhinitis: Secondary | ICD-10-CM | POA: Diagnosis not present

## 2019-08-28 DIAGNOSIS — J3081 Allergic rhinitis due to animal (cat) (dog) hair and dander: Secondary | ICD-10-CM | POA: Diagnosis not present

## 2019-08-31 DIAGNOSIS — J3089 Other allergic rhinitis: Secondary | ICD-10-CM | POA: Diagnosis not present

## 2019-08-31 DIAGNOSIS — J3081 Allergic rhinitis due to animal (cat) (dog) hair and dander: Secondary | ICD-10-CM | POA: Diagnosis not present

## 2019-08-31 DIAGNOSIS — J301 Allergic rhinitis due to pollen: Secondary | ICD-10-CM | POA: Diagnosis not present

## 2019-09-04 DIAGNOSIS — J301 Allergic rhinitis due to pollen: Secondary | ICD-10-CM | POA: Diagnosis not present

## 2019-09-08 DIAGNOSIS — J3081 Allergic rhinitis due to animal (cat) (dog) hair and dander: Secondary | ICD-10-CM | POA: Diagnosis not present

## 2019-09-08 DIAGNOSIS — J3089 Other allergic rhinitis: Secondary | ICD-10-CM | POA: Diagnosis not present

## 2019-09-08 DIAGNOSIS — J301 Allergic rhinitis due to pollen: Secondary | ICD-10-CM | POA: Diagnosis not present

## 2019-09-09 DIAGNOSIS — B070 Plantar wart: Secondary | ICD-10-CM | POA: Diagnosis not present

## 2019-09-11 DIAGNOSIS — J3081 Allergic rhinitis due to animal (cat) (dog) hair and dander: Secondary | ICD-10-CM | POA: Diagnosis not present

## 2019-09-11 DIAGNOSIS — J301 Allergic rhinitis due to pollen: Secondary | ICD-10-CM | POA: Diagnosis not present

## 2019-09-11 DIAGNOSIS — J3089 Other allergic rhinitis: Secondary | ICD-10-CM | POA: Diagnosis not present

## 2019-09-17 DIAGNOSIS — J3081 Allergic rhinitis due to animal (cat) (dog) hair and dander: Secondary | ICD-10-CM | POA: Diagnosis not present

## 2019-09-17 DIAGNOSIS — J301 Allergic rhinitis due to pollen: Secondary | ICD-10-CM | POA: Diagnosis not present

## 2019-09-17 DIAGNOSIS — J3089 Other allergic rhinitis: Secondary | ICD-10-CM | POA: Diagnosis not present

## 2019-09-21 DIAGNOSIS — J3081 Allergic rhinitis due to animal (cat) (dog) hair and dander: Secondary | ICD-10-CM | POA: Diagnosis not present

## 2019-09-21 DIAGNOSIS — J3089 Other allergic rhinitis: Secondary | ICD-10-CM | POA: Diagnosis not present

## 2019-09-22 DIAGNOSIS — J3081 Allergic rhinitis due to animal (cat) (dog) hair and dander: Secondary | ICD-10-CM | POA: Diagnosis not present

## 2019-09-22 DIAGNOSIS — J3089 Other allergic rhinitis: Secondary | ICD-10-CM | POA: Diagnosis not present

## 2019-09-23 DIAGNOSIS — B070 Plantar wart: Secondary | ICD-10-CM | POA: Diagnosis not present

## 2019-09-23 DIAGNOSIS — J3081 Allergic rhinitis due to animal (cat) (dog) hair and dander: Secondary | ICD-10-CM | POA: Diagnosis not present

## 2019-09-23 DIAGNOSIS — J3089 Other allergic rhinitis: Secondary | ICD-10-CM | POA: Diagnosis not present

## 2019-09-23 DIAGNOSIS — J301 Allergic rhinitis due to pollen: Secondary | ICD-10-CM | POA: Diagnosis not present

## 2019-10-01 DIAGNOSIS — J3089 Other allergic rhinitis: Secondary | ICD-10-CM | POA: Diagnosis not present

## 2019-10-01 DIAGNOSIS — J3081 Allergic rhinitis due to animal (cat) (dog) hair and dander: Secondary | ICD-10-CM | POA: Diagnosis not present

## 2019-10-01 DIAGNOSIS — J301 Allergic rhinitis due to pollen: Secondary | ICD-10-CM | POA: Diagnosis not present

## 2019-10-07 DIAGNOSIS — J3081 Allergic rhinitis due to animal (cat) (dog) hair and dander: Secondary | ICD-10-CM | POA: Diagnosis not present

## 2019-10-07 DIAGNOSIS — J3089 Other allergic rhinitis: Secondary | ICD-10-CM | POA: Diagnosis not present

## 2019-10-07 DIAGNOSIS — B070 Plantar wart: Secondary | ICD-10-CM | POA: Diagnosis not present

## 2019-10-07 DIAGNOSIS — L237 Allergic contact dermatitis due to plants, except food: Secondary | ICD-10-CM | POA: Diagnosis not present

## 2019-10-07 DIAGNOSIS — J301 Allergic rhinitis due to pollen: Secondary | ICD-10-CM | POA: Diagnosis not present

## 2019-10-13 DIAGNOSIS — J3081 Allergic rhinitis due to animal (cat) (dog) hair and dander: Secondary | ICD-10-CM | POA: Diagnosis not present

## 2019-10-13 DIAGNOSIS — J3089 Other allergic rhinitis: Secondary | ICD-10-CM | POA: Diagnosis not present

## 2019-10-13 DIAGNOSIS — J301 Allergic rhinitis due to pollen: Secondary | ICD-10-CM | POA: Diagnosis not present

## 2019-10-16 DIAGNOSIS — J3089 Other allergic rhinitis: Secondary | ICD-10-CM | POA: Diagnosis not present

## 2019-10-16 DIAGNOSIS — J3081 Allergic rhinitis due to animal (cat) (dog) hair and dander: Secondary | ICD-10-CM | POA: Diagnosis not present

## 2019-10-16 DIAGNOSIS — J301 Allergic rhinitis due to pollen: Secondary | ICD-10-CM | POA: Diagnosis not present

## 2019-10-20 DIAGNOSIS — J3089 Other allergic rhinitis: Secondary | ICD-10-CM | POA: Diagnosis not present

## 2019-10-20 DIAGNOSIS — J301 Allergic rhinitis due to pollen: Secondary | ICD-10-CM | POA: Diagnosis not present

## 2019-10-20 DIAGNOSIS — J3081 Allergic rhinitis due to animal (cat) (dog) hair and dander: Secondary | ICD-10-CM | POA: Diagnosis not present

## 2019-10-22 DIAGNOSIS — J3089 Other allergic rhinitis: Secondary | ICD-10-CM | POA: Diagnosis not present

## 2019-10-22 DIAGNOSIS — J3081 Allergic rhinitis due to animal (cat) (dog) hair and dander: Secondary | ICD-10-CM | POA: Diagnosis not present

## 2019-10-22 DIAGNOSIS — J301 Allergic rhinitis due to pollen: Secondary | ICD-10-CM | POA: Diagnosis not present

## 2019-10-29 DIAGNOSIS — J3089 Other allergic rhinitis: Secondary | ICD-10-CM | POA: Diagnosis not present

## 2019-10-29 DIAGNOSIS — J301 Allergic rhinitis due to pollen: Secondary | ICD-10-CM | POA: Diagnosis not present

## 2019-10-29 DIAGNOSIS — J3081 Allergic rhinitis due to animal (cat) (dog) hair and dander: Secondary | ICD-10-CM | POA: Diagnosis not present

## 2019-11-06 DIAGNOSIS — J301 Allergic rhinitis due to pollen: Secondary | ICD-10-CM | POA: Diagnosis not present

## 2019-11-06 DIAGNOSIS — J3089 Other allergic rhinitis: Secondary | ICD-10-CM | POA: Diagnosis not present

## 2019-11-06 DIAGNOSIS — J3081 Allergic rhinitis due to animal (cat) (dog) hair and dander: Secondary | ICD-10-CM | POA: Diagnosis not present

## 2019-11-10 DIAGNOSIS — J3081 Allergic rhinitis due to animal (cat) (dog) hair and dander: Secondary | ICD-10-CM | POA: Diagnosis not present

## 2019-11-10 DIAGNOSIS — J301 Allergic rhinitis due to pollen: Secondary | ICD-10-CM | POA: Diagnosis not present

## 2019-11-10 DIAGNOSIS — J3089 Other allergic rhinitis: Secondary | ICD-10-CM | POA: Diagnosis not present

## 2019-11-10 DIAGNOSIS — B070 Plantar wart: Secondary | ICD-10-CM | POA: Diagnosis not present

## 2019-11-19 DIAGNOSIS — J3089 Other allergic rhinitis: Secondary | ICD-10-CM | POA: Diagnosis not present

## 2019-11-19 DIAGNOSIS — J3081 Allergic rhinitis due to animal (cat) (dog) hair and dander: Secondary | ICD-10-CM | POA: Diagnosis not present

## 2019-11-19 DIAGNOSIS — J301 Allergic rhinitis due to pollen: Secondary | ICD-10-CM | POA: Diagnosis not present

## 2019-11-24 DIAGNOSIS — J3081 Allergic rhinitis due to animal (cat) (dog) hair and dander: Secondary | ICD-10-CM | POA: Diagnosis not present

## 2019-11-24 DIAGNOSIS — J301 Allergic rhinitis due to pollen: Secondary | ICD-10-CM | POA: Diagnosis not present

## 2019-11-24 DIAGNOSIS — J3089 Other allergic rhinitis: Secondary | ICD-10-CM | POA: Diagnosis not present

## 2019-12-02 DIAGNOSIS — J3089 Other allergic rhinitis: Secondary | ICD-10-CM | POA: Diagnosis not present

## 2019-12-02 DIAGNOSIS — J301 Allergic rhinitis due to pollen: Secondary | ICD-10-CM | POA: Diagnosis not present

## 2019-12-02 DIAGNOSIS — J3081 Allergic rhinitis due to animal (cat) (dog) hair and dander: Secondary | ICD-10-CM | POA: Diagnosis not present

## 2019-12-04 DIAGNOSIS — J3081 Allergic rhinitis due to animal (cat) (dog) hair and dander: Secondary | ICD-10-CM | POA: Diagnosis not present

## 2019-12-04 DIAGNOSIS — J3089 Other allergic rhinitis: Secondary | ICD-10-CM | POA: Diagnosis not present

## 2019-12-04 DIAGNOSIS — K200 Eosinophilic esophagitis: Secondary | ICD-10-CM | POA: Diagnosis not present

## 2019-12-04 DIAGNOSIS — J301 Allergic rhinitis due to pollen: Secondary | ICD-10-CM | POA: Diagnosis not present

## 2019-12-10 DIAGNOSIS — J301 Allergic rhinitis due to pollen: Secondary | ICD-10-CM | POA: Diagnosis not present

## 2019-12-10 DIAGNOSIS — J3081 Allergic rhinitis due to animal (cat) (dog) hair and dander: Secondary | ICD-10-CM | POA: Diagnosis not present

## 2019-12-10 DIAGNOSIS — J3089 Other allergic rhinitis: Secondary | ICD-10-CM | POA: Diagnosis not present

## 2019-12-13 IMAGING — CR DG CHEST 2V
2 series · 2 of 2 positions shown · non-contrast
Comparison: None.

CLINICAL DATA: Chest pain

EXAM:
CHEST - 2 VIEW

[w chest pa]
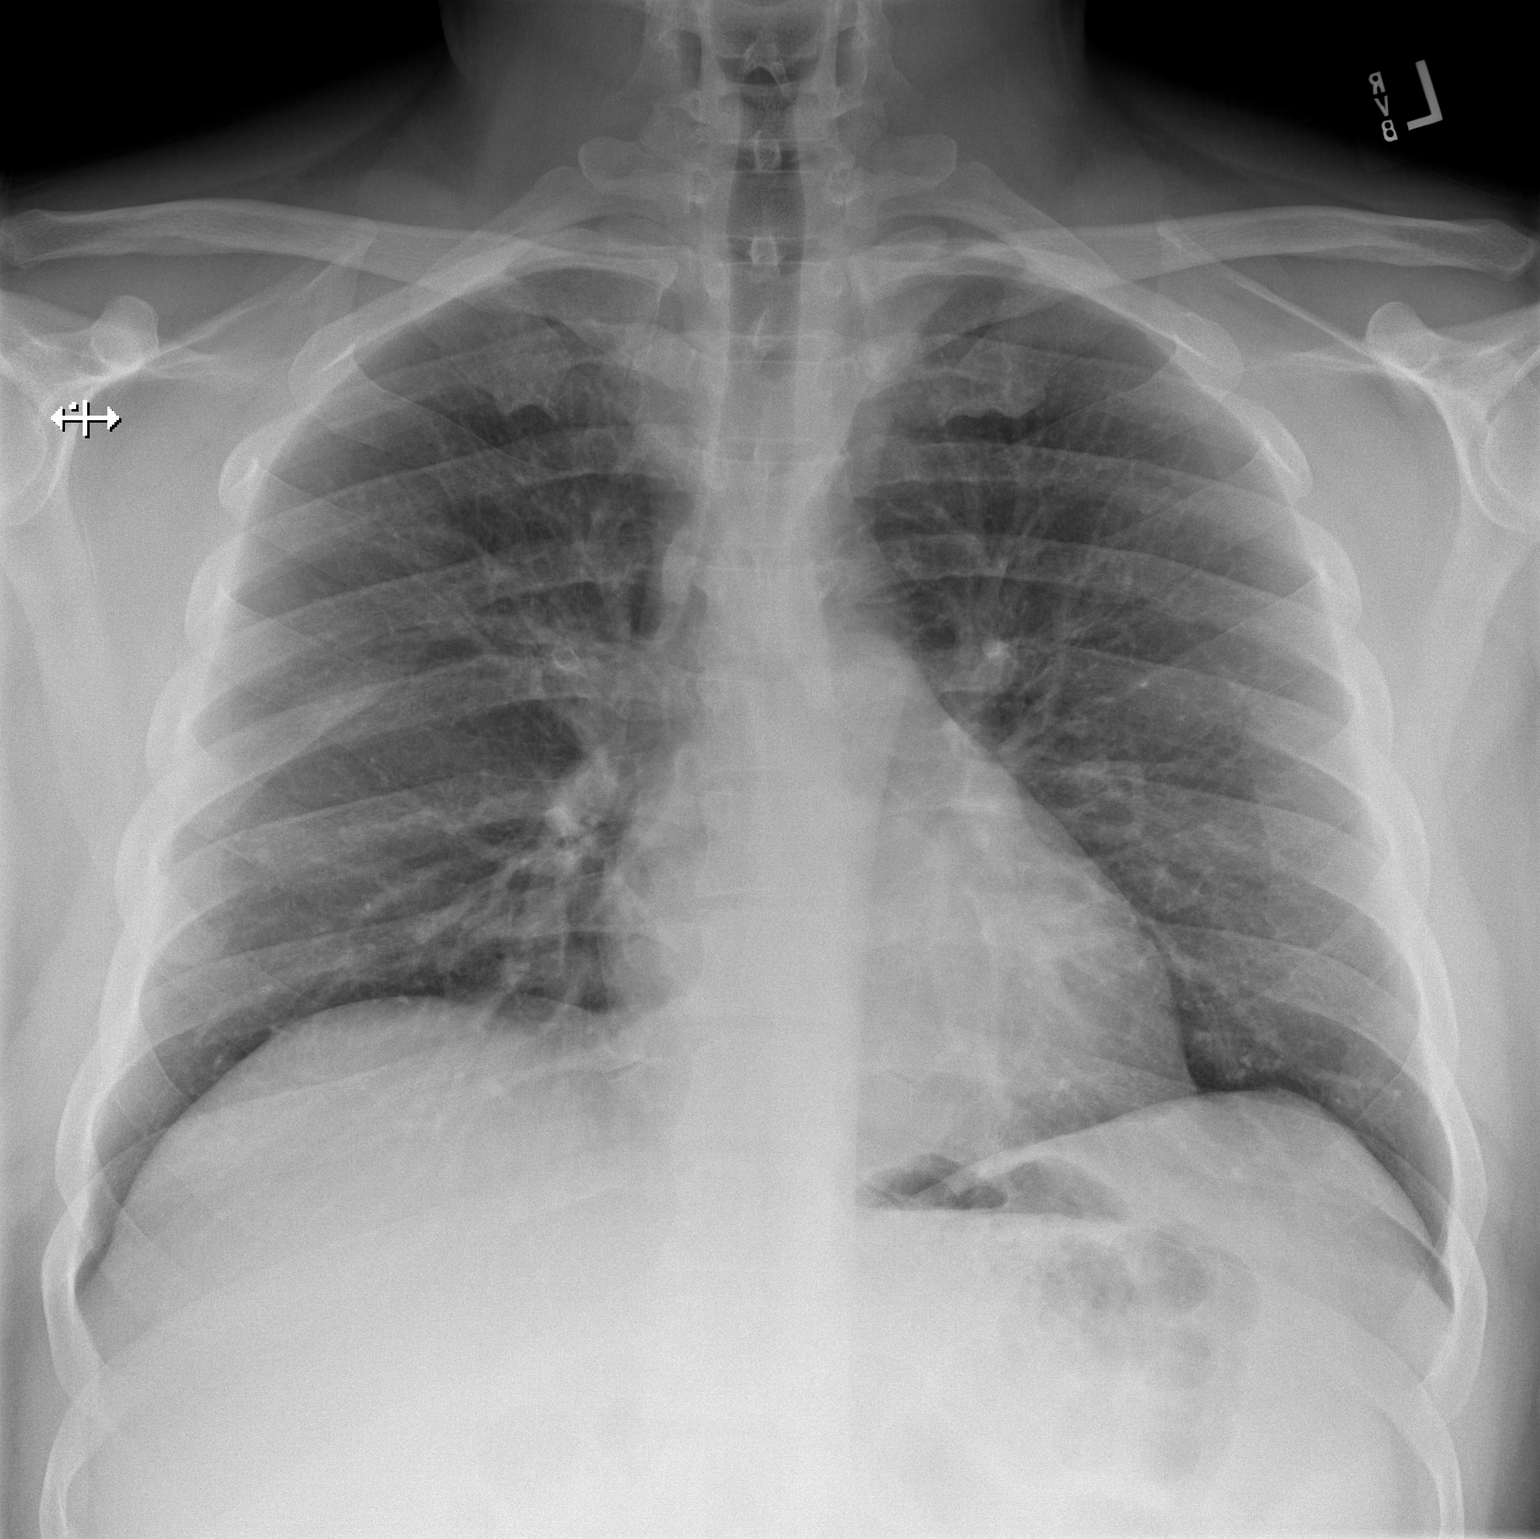

[w chest lat]
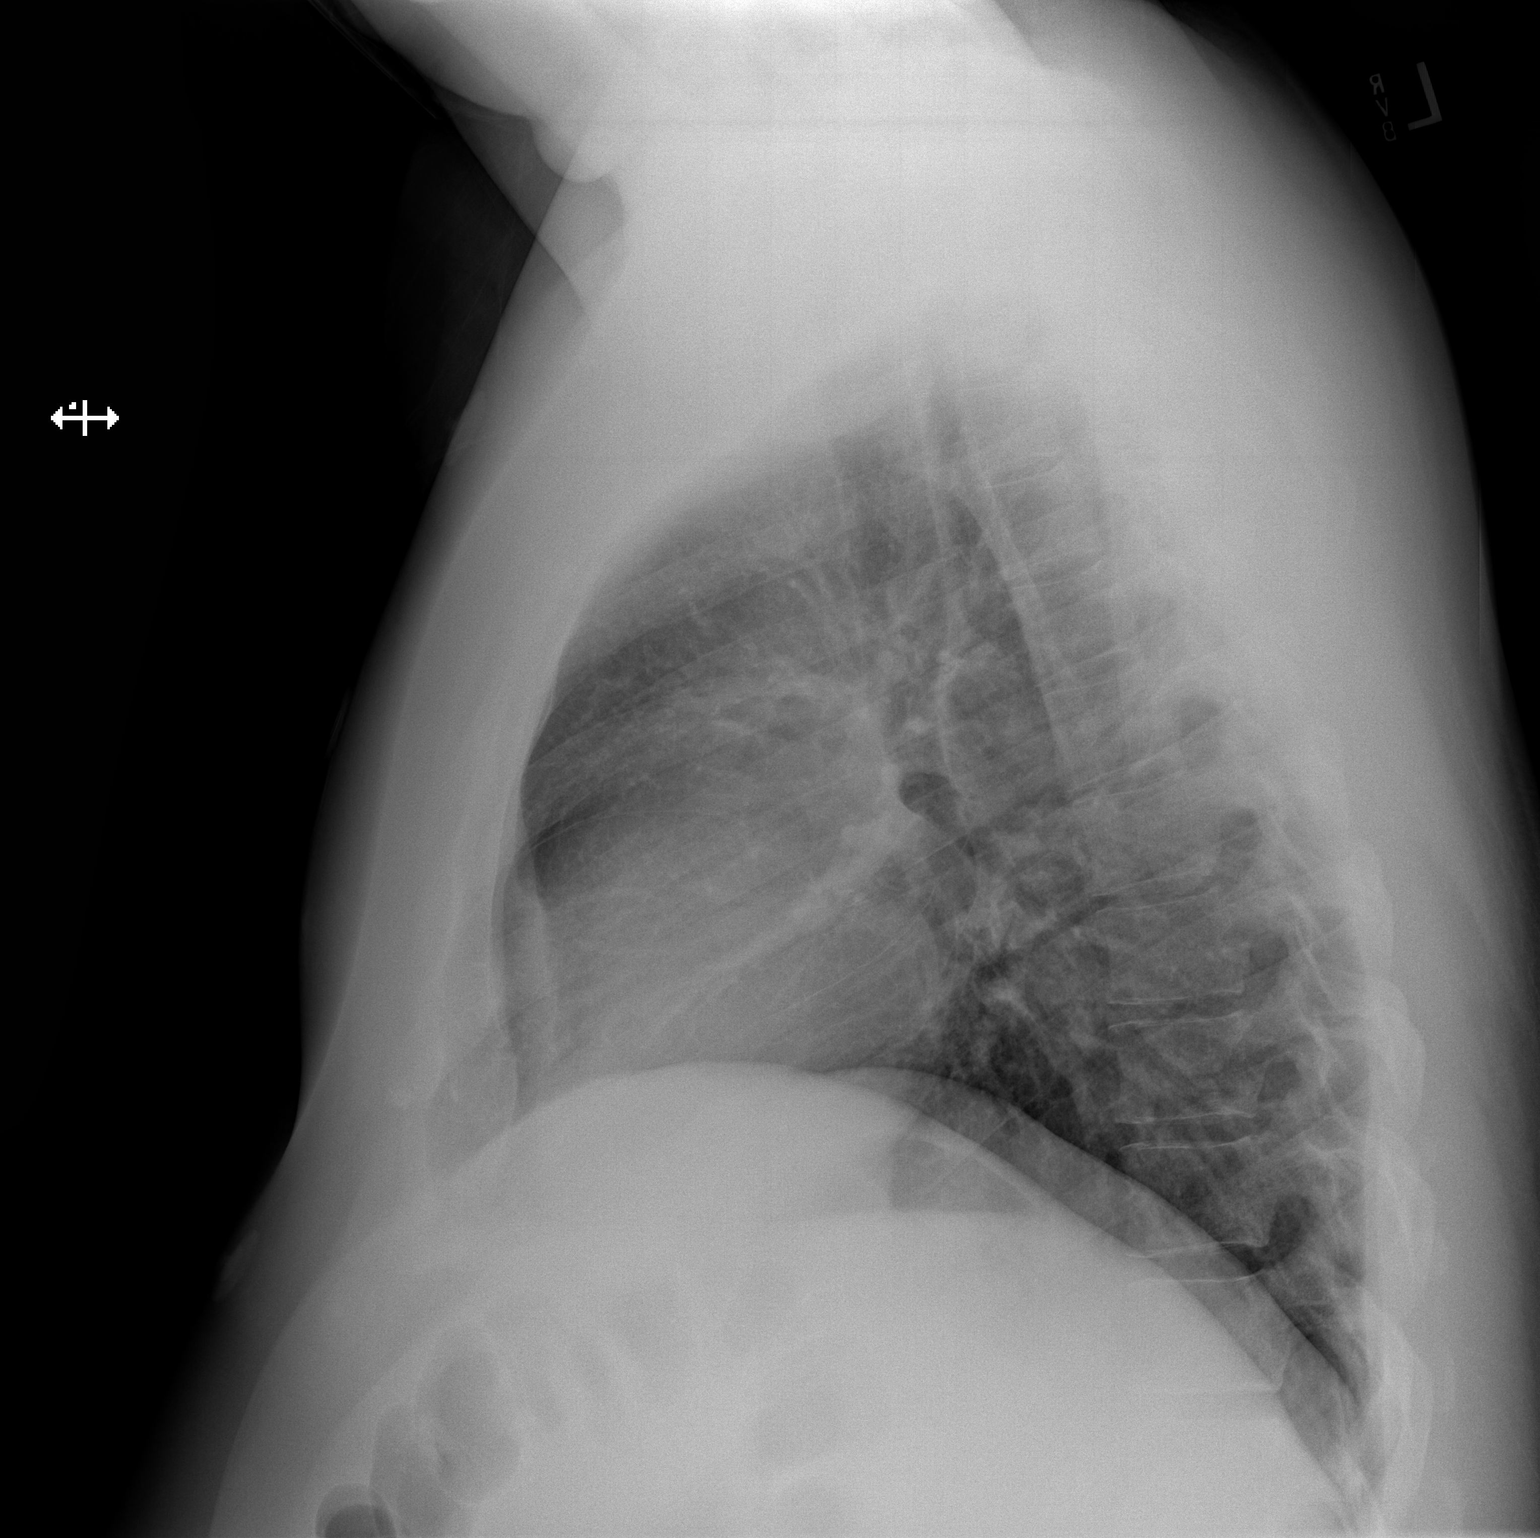

[2 of 2 positions shown; findings below may reference images not displayed]

FINDINGS: Lungs are clear. Heart size and pulmonary vascularity are normal. No
adenopathy. No pneumothorax. No bone lesions.
IMPRESSION: No edema or consolidation.

## 2019-12-16 DIAGNOSIS — J3089 Other allergic rhinitis: Secondary | ICD-10-CM | POA: Diagnosis not present

## 2019-12-16 DIAGNOSIS — J301 Allergic rhinitis due to pollen: Secondary | ICD-10-CM | POA: Diagnosis not present

## 2019-12-16 DIAGNOSIS — J3081 Allergic rhinitis due to animal (cat) (dog) hair and dander: Secondary | ICD-10-CM | POA: Diagnosis not present

## 2019-12-22 DIAGNOSIS — J3081 Allergic rhinitis due to animal (cat) (dog) hair and dander: Secondary | ICD-10-CM | POA: Diagnosis not present

## 2019-12-22 DIAGNOSIS — J301 Allergic rhinitis due to pollen: Secondary | ICD-10-CM | POA: Diagnosis not present

## 2019-12-22 DIAGNOSIS — J3089 Other allergic rhinitis: Secondary | ICD-10-CM | POA: Diagnosis not present

## 2019-12-29 DIAGNOSIS — J3081 Allergic rhinitis due to animal (cat) (dog) hair and dander: Secondary | ICD-10-CM | POA: Diagnosis not present

## 2019-12-29 DIAGNOSIS — J301 Allergic rhinitis due to pollen: Secondary | ICD-10-CM | POA: Diagnosis not present

## 2019-12-29 DIAGNOSIS — J3089 Other allergic rhinitis: Secondary | ICD-10-CM | POA: Diagnosis not present

## 2020-01-05 DIAGNOSIS — J3081 Allergic rhinitis due to animal (cat) (dog) hair and dander: Secondary | ICD-10-CM | POA: Diagnosis not present

## 2020-01-05 DIAGNOSIS — J3089 Other allergic rhinitis: Secondary | ICD-10-CM | POA: Diagnosis not present

## 2020-01-05 DIAGNOSIS — J301 Allergic rhinitis due to pollen: Secondary | ICD-10-CM | POA: Diagnosis not present

## 2020-01-14 DIAGNOSIS — J3089 Other allergic rhinitis: Secondary | ICD-10-CM | POA: Diagnosis not present

## 2020-01-14 DIAGNOSIS — J3081 Allergic rhinitis due to animal (cat) (dog) hair and dander: Secondary | ICD-10-CM | POA: Diagnosis not present

## 2020-01-14 DIAGNOSIS — J301 Allergic rhinitis due to pollen: Secondary | ICD-10-CM | POA: Diagnosis not present

## 2020-01-19 DIAGNOSIS — J3081 Allergic rhinitis due to animal (cat) (dog) hair and dander: Secondary | ICD-10-CM | POA: Diagnosis not present

## 2020-01-19 DIAGNOSIS — J301 Allergic rhinitis due to pollen: Secondary | ICD-10-CM | POA: Diagnosis not present

## 2020-01-19 DIAGNOSIS — J3089 Other allergic rhinitis: Secondary | ICD-10-CM | POA: Diagnosis not present

## 2020-01-26 DIAGNOSIS — J3081 Allergic rhinitis due to animal (cat) (dog) hair and dander: Secondary | ICD-10-CM | POA: Diagnosis not present

## 2020-01-26 DIAGNOSIS — J3089 Other allergic rhinitis: Secondary | ICD-10-CM | POA: Diagnosis not present

## 2020-01-26 DIAGNOSIS — J301 Allergic rhinitis due to pollen: Secondary | ICD-10-CM | POA: Diagnosis not present

## 2020-02-03 DIAGNOSIS — J3081 Allergic rhinitis due to animal (cat) (dog) hair and dander: Secondary | ICD-10-CM | POA: Diagnosis not present

## 2020-02-03 DIAGNOSIS — J301 Allergic rhinitis due to pollen: Secondary | ICD-10-CM | POA: Diagnosis not present

## 2020-02-03 DIAGNOSIS — J3089 Other allergic rhinitis: Secondary | ICD-10-CM | POA: Diagnosis not present

## 2020-04-04 ENCOUNTER — Other Ambulatory Visit: Payer: Self-pay | Admitting: Family Medicine

## 2020-04-04 ENCOUNTER — Ambulatory Visit
Admission: RE | Admit: 2020-04-04 | Discharge: 2020-04-04 | Disposition: A | Discharge status: Home / Self Care | Payer: BC Managed Care – PPO | Source: Ambulatory Visit | Attending: Family Medicine | Admitting: Family Medicine

## 2020-04-04 DIAGNOSIS — R1031 Right lower quadrant pain: Secondary | ICD-10-CM

## 2020-04-04 MED ORDER — IOPAMIDOL (ISOVUE-300) INJECTION 61%
100.0000 mL | Freq: Once | INTRAVENOUS | Status: AC | PRN
  Administered 2020-04-04: 100 mL via INTRAVENOUS

## 2020-05-18 ENCOUNTER — Telehealth: Payer: Self-pay

## 2020-05-18 NOTE — Telephone Encounter (Signed)
CallerChristoher Drudge  Call Back @ [phone]  Patient would like to establish care with you, Patient referred to you by his wife (your patient) Orlie Pollen.

## 2020-05-18 NOTE — Telephone Encounter (Signed)
Sure.  I will see him

## 2020-06-07 NOTE — Progress Notes (Signed)
[facility] [address] [place], [place] [address] [phone]  Date:  06/09/2020   Name:  Jared Blake   DOB:  January 12, 1986   MRN:  [id number]  PCP:  Jared Lund, PA    Chief Complaint: New Patient (Initial Visit)   History of Present Illness:  Jared Blake is a 35 y.o. very pleasant male patient who presents with the following:  New patient here today to establish care-I take care of his wife Jared Blake  He had been seeing a PCP previously but would like to change to our office as his wife is here  He does have snoring at night and may feel tired during the day, and his wife has noted apneic spells.  He would like to be tested for sleep apnea He does have his tonsils still  He has a lot of allergies- he has been scratch tested and had a lot of positives  He is doing allergy shots now- he stopped during the pandemic but is back on this now for the last year He thinks helping some  He had nose surgery in 2020- septum and turbinate reduction.  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   Past Medical History:  Diagnosis Date  . Allergy   . Esophagitis     Past Surgical History:  Procedure Laterality Date  . bilateral turbinnde reduction    . SEPTOPLASTY      Social History   Tobacco Use  . Smoking status: Never Smoker  . Smokeless tobacco: Never Used  Vaping Use  . Vaping Use: Never used  Substance Use  Topics  . Alcohol use: Yes    Comment: social  . Drug use: Never    Family History  Problem Relation Age of Onset  . Diabetes Mother   . Diabetes Father     No Known Allergies  Medication list has been reviewed and updated.  Current Outpatient Medications on File Prior to Visit  Medication Sig Dispense Refill  . cetirizine (ZYRTEC) 10 MG tablet Take 10 mg by mouth daily.    . fexofenadine (ALLEGRA) 180 MG tablet 1 tablet every morning    . fluticasone (FLONASE) 50 MCG/ACT nasal spray 1-2 spray in each nostril     No current facility-administered medications on file prior to visit.    Review of Systems:  As per HPI- otherwise negative.   Physical Examination: Vitals:   06/09/20 1327  BP: 139/84  Pulse: 97  Temp: (!) 96.7 F (35.9 C)  SpO2: 97%   Vitals:   06/09/20 1327  Weight: (!) 310 lb (140.6 kg)  Height: 6\' 3"  (1.905 m)   Body mass index is 38.75 kg/m. Ideal Body Weight: Weight in (lb) to have BMI = 25: 199.6  GEN: no acute distress.  Obese, otherwise looks well HEENT: Atraumatic, Normocephalic. Bilateral TM wnl,  oropharynx normal.  PEERL,EOMI. he does not have tonsillar hypertrophy Ears and Nose: No external deformity. CV: RRR, No M/G/R. No JVD. No thrill. No extra heart sounds. PULM: CTA B, no wheezes, crackles, rhonchi. No retractions. No resp. distress. No accessory muscle use. ABD: S, NT, ND, +BS. No rebound. No HSM. EXTR: No c/c/e PSYCH: Normally interactive. Conversant.    Assessment and Plan: Snoring - Plan: Ambulatory referral to Neurology  Screening for hyperlipidemia - Plan: Lipid panel  Screening for diabetes mellitus - Plan: Comprehensive metabolic panel, Hemoglobin A1c  Screening for deficiency anemia - Plan: CBC  Weight gain - Plan: TSH  Immunization due - Plan: Tdap vaccine greater than or equal to 7yo IM  Here today to establish care and discuss a couple of concerns Routine labs pending as above Updated tetanus Discussed  COVID-19, he is fully immunized Weight gain-check thyroid, encouraged continued exercise He does walk about 45 minutes daily Referral to neurology for sleep evaluation, likely sleep apnea He plans to return for fasting labs soon  This visit occurred during the SARS-CoV-2 public health emergency.  Safety protocols were in place, including screening questions prior to the visit, additional usage of staff PPE, and extensive cleaning of exam room while observing appropriate contact time as indicated for disinfecting solutions.    Signed Abbe Amsterdam, MD

## 2020-06-09 ENCOUNTER — Ambulatory Visit: Payer: 59 | Admitting: Family Medicine

## 2020-06-09 ENCOUNTER — Other Ambulatory Visit: Payer: Self-pay

## 2020-06-09 ENCOUNTER — Encounter: Payer: Self-pay | Admitting: Family Medicine

## 2020-06-09 VITALS — BP 139/84 | HR 97 | Temp 96.7°F | Ht 75.0 in | Wt 310.0 lb

## 2020-06-09 DIAGNOSIS — R0683 Snoring: Secondary | ICD-10-CM | POA: Diagnosis not present

## 2020-06-09 DIAGNOSIS — Z23 Encounter for immunization: Secondary | ICD-10-CM

## 2020-06-09 DIAGNOSIS — Z130 Encounter for screening for diseases of the blood and blood-forming organs and certain disorders involving the immune mechanism: Secondary | ICD-10-CM

## 2020-06-09 DIAGNOSIS — Z131 Encounter for screening for diabetes mellitus: Secondary | ICD-10-CM | POA: Diagnosis not present

## 2020-06-09 DIAGNOSIS — Z13220 Encounter for screening for lipoid disorders: Secondary | ICD-10-CM

## 2020-06-09 DIAGNOSIS — R635 Abnormal weight gain: Secondary | ICD-10-CM

## 2020-06-09 NOTE — Patient Instructions (Signed)
Good to see you again today!  Please schedule a lab visit at your convenience for a blood draw referral to neurology placed to discus possible sleep apnea Tetanus given today (tdap)  Take care!  Keep working on diet and exercise

## 2020-06-14 ENCOUNTER — Other Ambulatory Visit: Payer: Self-pay

## 2020-06-14 ENCOUNTER — Other Ambulatory Visit (INDEPENDENT_AMBULATORY_CARE_PROVIDER_SITE_OTHER): Payer: 59

## 2020-06-14 DIAGNOSIS — Z13220 Encounter for screening for lipoid disorders: Secondary | ICD-10-CM

## 2020-06-14 DIAGNOSIS — Z130 Encounter for screening for diseases of the blood and blood-forming organs and certain disorders involving the immune mechanism: Secondary | ICD-10-CM

## 2020-06-14 DIAGNOSIS — Z131 Encounter for screening for diabetes mellitus: Secondary | ICD-10-CM

## 2020-06-14 DIAGNOSIS — R635 Abnormal weight gain: Secondary | ICD-10-CM

## 2020-06-14 LAB — COMPREHENSIVE METABOLIC PANEL
ALT: 61 U/L — ABNORMAL HIGH (ref 0–53)
AST: 45 U/L — ABNORMAL HIGH (ref 0–37)
Albumin: 4.7 g/dL (ref 3.5–5.2)
Alkaline Phosphatase: 66 U/L (ref 39–117)
BUN: 13 mg/dL (ref 6–23)
CO2: 27 mEq/L (ref 19–32)
Calcium: 9.9 mg/dL (ref 8.4–10.5)
Chloride: 101 mEq/L (ref 96–112)
Creatinine, Ser: 0.94 mg/dL (ref 0.40–1.50)
GFR: 105.34 mL/min (ref >60.00)
Glucose, Bld: 142 mg/dL — ABNORMAL HIGH (ref 70–99)
Potassium: 4.4 mEq/L (ref 3.5–5.1)
Sodium: 137 mEq/L (ref 135–145)
Total Bilirubin: 0.9 mg/dL (ref 0.2–1.2)
Total Protein: 7.2 g/dL (ref 6.0–8.3)

## 2020-06-14 LAB — LIPID PANEL
Cholesterol: 202 mg/dL — ABNORMAL HIGH (ref 0–200)
HDL: 32 mg/dL — ABNORMAL LOW (ref >39.00)
NonHDL: 170.2
Total CHOL/HDL Ratio: 6
Triglycerides: 276 mg/dL — ABNORMAL HIGH (ref 0.0–149.0)
VLDL: 55.2 mg/dL — ABNORMAL HIGH (ref 0.0–40.0)

## 2020-06-14 LAB — LDL CHOLESTEROL, DIRECT: Direct LDL: 113 mg/dL

## 2020-06-14 LAB — CBC
HCT: 46.6 % (ref 39.0–52.0)
Hemoglobin: 16.1 g/dL (ref 13.0–17.0)
MCHC: 34.5 g/dL (ref 30.0–36.0)
MCV: 84.2 fl (ref 78.0–100.0)
Platelets: 181 10*3/uL (ref 150.0–400.0)
RBC: 5.54 Mil/uL (ref 4.22–5.81)
RDW: 13.9 % (ref 11.5–15.5)
WBC: 8 10*3/uL (ref 4.0–10.5)

## 2020-06-14 LAB — HEMOGLOBIN A1C: Hgb A1c MFr Bld: 7.4 % — ABNORMAL HIGH (ref 4.6–6.5)

## 2020-06-14 LAB — TSH: TSH: 2.13 u[IU]/mL (ref 0.35–4.50)

## 2020-06-15 ENCOUNTER — Encounter: Payer: Self-pay | Admitting: Family Medicine

## 2020-06-15 ENCOUNTER — Other Ambulatory Visit: Payer: 59

## 2020-06-15 MED ORDER — METFORMIN HCL 500 MG PO TABS: 500.0000 mg | ORAL_TABLET | Freq: Two times a day (BID) | ORAL | 3 refills | Status: DC

## 2020-06-24 NOTE — Telephone Encounter (Signed)
Okay to send in glucometer? If so what is the sig

## 2020-08-23 ENCOUNTER — Encounter: Payer: Self-pay | Admitting: Neurology

## 2020-08-23 ENCOUNTER — Other Ambulatory Visit: Payer: Self-pay

## 2020-08-23 ENCOUNTER — Ambulatory Visit: Payer: 59 | Admitting: Neurology

## 2020-08-23 DIAGNOSIS — Z9889 Other specified postprocedural states: Secondary | ICD-10-CM | POA: Diagnosis not present

## 2020-08-23 DIAGNOSIS — R0683 Snoring: Secondary | ICD-10-CM | POA: Insufficient documentation

## 2020-08-23 DIAGNOSIS — T733XXA Exhaustion due to excessive exertion, initial encounter: Secondary | ICD-10-CM | POA: Insufficient documentation

## 2020-08-23 DIAGNOSIS — T733XXS Exhaustion due to excessive exertion, sequela: Secondary | ICD-10-CM

## 2020-08-23 NOTE — Progress Notes (Signed)
SLEEP MEDICINE CLINIC    Provider:  Melvyn Novas, MD  Primary Care Physician:  Pearline Cables, MD [address] [place] [address]     Referring Provider: Pearline Cables, Md [address] [place],  [place] [address]          Chief Complaint according to patient   Patient presents with:     New Patient (Initial Visit)      Pt presents today for ongoing concerns potential osa. Never had a SS. Wife has witnessed apnea events and snoring for a long time. Avg 6-8 hrs of sleep andstates may get up 1 time to void      HISTORY OF PRESENT ILLNESS:  Jared Blake is a 35 y.o. year old Caucasian male patient seen here upon referral on 08/23/2020 from Copland, MD  for a sleep consultation.  Chief concern according to patient :  "My wife reports I snore and stop breathing"    I have the pleasure of seeing Jared Blake today, a right-handed Caucasian male with a possible sleep disorder.  He  has a past medical history of seasonal and non seasonal respiratory Allergy DM type 2, and non-GERD-Esophagitis, allergic.    Sleep relevant medical history: Snoring, allergies, daily medication- no Tonsillectomy but turbinate reduction and deviated septum repair.   Family medical /sleep history: no other family member on CPAP with OSA, insomnia, sleep walkers. His mother is a very loud snorer, and she falls asleep easily- has asthma.    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 The patient currently works remote. Tobacco XTA:VWPV ., grew up with passive smoke exposure-   ETOH use; rare  ,  Caffeine intake in form of Coffee( /) Soda( rare ) Tea ( /) no energy drinks. Regular exercise in form of  walking .     Sleep habits are as follows: The patient's dinner time is between 7-8 PM. The patient goes to bed at 12 PM and continues to sleep  for an average of 7 hours, wakes for one bathroom break. The preferred sleep position is non supine, left -no TV in bed-, with the support of 1 pillow. Dreams are reportedly rare   8.15AM is the usual rise time. The patient wakes up with an alarm- he snoozes the alarm frequently.  He reports not feeling refreshed or restored in AM, with symptoms such as dry mouth, and residual fatigue.  It takes until noon to completely wake up! Naps are taken infrequently, lasting from 30 to 45 minutes and he may sleep in front of TV-  are more refreshing than nocturnal sleep.    Review of Systems: Out of a complete 14 system review, the patient complains of only the following symptoms, and all other reviewed systems are negative.:  Fatigue, sleepiness , snoring, fragmented sleep, Insomnia.  Wife has leukemia- chronic fatigue- mast cell activation syndrome.    How likely are you to doze in the following situations: 0 = not likely, 1 = slight chance, 2 = moderate chance, 3 = high chance   Sitting and Reading? Watching Television? Sitting inactive in a public place (theater or meeting)? As a passenger in a car for an hour without a break? Lying down in the afternoon when circumstances permit? Sitting and talking to someone? Sitting quietly after  lunch without alcohol? In a car, while stopped for a few minutes in traffic?   Total = 1212/ 24 points   FSS endorsed at 48/ 63 points.   Social History   Socioeconomic History   Marital status: Married    Spouse name: Not on file   Number of children: Not on file   Years of education: Not on file   Highest education level: Not on file  Occupational History   Not on file  Tobacco Use   Smoking status: Never   Smokeless tobacco: Never  Vaping Use   Vaping Use: Never used  Substance and Sexual Activity   Alcohol use: Yes    Comment: social   Drug use: Never   Sexual activity: Not on file  Other Topics Concern   Not on file  Social History  Narrative   Not on file   Social Determinants of Health   Financial Resource Strain: Not on file  Food Insecurity: Not on file  Transportation Needs: Not on file  Physical Activity: Not on file  Stress: Not on file  Social Connections: Not on file    Family History  Problem Relation Age of Onset   Diabetes Mother    Diabetes Father     Past Medical History:  Diagnosis Date   Allergy    Esophagitis     Past Surgical History:  Procedure Laterality Date   bilateral turbinnde reduction     SEPTOPLASTY       Current Outpatient Medications on File Prior to Visit  Medication Sig Dispense Refill   cetirizine (ZYRTEC) 10 MG tablet Take 10 mg by mouth daily.     fexofenadine (ALLEGRA) 180 MG tablet 1 tablet every morning     fluticasone (FLONASE) 50 MCG/ACT nasal spray 1-2 spray in each nostril     metFORMIN (GLUCOPHAGE) 500 MG tablet Take 1 tablet (500 mg total) by mouth 2 (two) times daily with a meal. 180 tablet 3   No current facility-administered medications on file prior to visit.    No Known Allergies  Physical exam:  Today's Vitals   08/23/20 0958  BP: 131/79  Pulse: 81  Weight: 298 lb (135.2 kg)  Height: 6\' 3"  (1.905 m)   Body mass index is 37.25 kg/m.   Wt Readings from Last 3 Encounters:  08/23/20 298 lb (135.2 kg)  06/09/20 (!) 310 lb (140.6 kg)  10/20/18 285 lb (129.3 kg)     Ht Readings from Last 3 Encounters:  08/23/20 6\' 3"  (1.905 m)  06/09/20 6\' 3"  (1.905 m)  10/20/18 6\' 3"  (1.905 m)      General: The patient is awake, alert and appears not in acute distress. The patient is well groomed. Head: Normocephalic, atraumatic. Neck is supple. Mallampati 2-3,  neck circumference:18.25 inches . Nasal airflow patent.  Retrognathia is seen.  Dental status: some crowding, small jaw-and facial hair- Cardiovascular:  Regular rate and cardiac rhythm by pulse,  without distended neck veins. Respiratory: Lungs are clear to auscultation.  Skin:  Without  evidence of ankle edema, or rash. Trunk: The patient's posture is erect.   Neurologic exam : The patient is awake and alert, oriented to place and time.   Memory subjective described as intact.  Attention span & concentration ability appears normal.  Speech is fluent,  without  dysarthria, dysphonia or aphasia.  Mood and affect are appropriate.   Cranial nerves: no loss of smell or taste reported  Pupils are equal and briskly reactive  to light. Funduscopic exam deferred.  Extraocular movements in vertical and horizontal planes were intact and without nystagmus. No Diplopia. Visual fields by finger perimetry are intact. Hearing was intact to soft voice and finger rubbing.    Facial sensation intact to fine touch.  Facial motor strength is symmetric and tongue and uvula move midline.  Neck ROM : rotation, tilt and flexion extension were normal for age and shoulder shrug was symmetrical.    Motor exam:  Symmetric bulk, tone and ROM.   Normal tone without cog- wheeling, symmetric grip strength .   Sensory:  Fine touch  and vibration were tested and normal.  Proprioception tested in the upper extremities was normal.   Coordination: Rapid alternating movements in the fingers/hands were of normal speed.  The Finger-to-nose maneuver was intact without evidence of ataxia, dysmetria or tremor. Gait and station: Patient could rise unassisted from a seated position, walked without assistive device.  Stance is of normal width/ base and the patient turned with 3 steps.  Toe and heel walk were deferred.  Deep tendon reflexes: in the  upper and lower extremities are symmetrically attenuated- and intact.  Babinski response was deferred       After spending a total time of  45  minutes face to face and additional time for physical and neurologic examination, review of laboratory studies,  personal review of imaging studies, reports and results of other testing and review of referral information /  records as far as provided in visit, I have established the following assessments:  Jared Blake also reports that when he had his nasal surgery he was staying in a recovery and observation area for a prolonged period of time as his sleep apnea interfered with the extubation.  1) small jaw, high grade Mallampati, elevated neck size and BMI.  Long time snorer, since teenage- worsening with slow chronic weight gain over a decade.  2) DM 3) high risk of OSA.  4) had no COVID yet- vaccinated- wife has leukemia.    My Plan is to proceed with:  1) HST preferred- not in lab.    I would like to thank Copland, Gwenlyn Found, MD / [address] [place],  [place] [address] for allowing me to meet with and to take care of this pleasant patient.   In short, Jared Blake is presenting with OSA, a symptom that can be attributed to weight and airway anatomy.   I plan to follow up either personally or through our NP within 3 month.   CC: I will share my notes with PCP.Marland Kitchen  Electronically signed by: Melvyn Novas, MD 08/23/2020 10:14 AM  [facility] Board certified by The [employer] of Sleep Medicine and Diplomate of the [employer] of Sleep Medicine. Board certified In Neurology through the ABPN, Fellow of the [employer] of Neurology. Medical Director of [facility].

## 2020-09-14 ENCOUNTER — Ambulatory Visit: Payer: 59 | Admitting: Family Medicine

## 2020-09-19 ENCOUNTER — Ambulatory Visit: Payer: 59 | Admitting: Neurology

## 2020-09-19 DIAGNOSIS — G4733 Obstructive sleep apnea (adult) (pediatric): Secondary | ICD-10-CM | POA: Diagnosis not present

## 2020-09-19 DIAGNOSIS — Z9889 Other specified postprocedural states: Secondary | ICD-10-CM

## 2020-09-19 DIAGNOSIS — T733XXS Exhaustion due to excessive exertion, sequela: Secondary | ICD-10-CM

## 2020-09-19 DIAGNOSIS — R0683 Snoring: Secondary | ICD-10-CM

## 2020-09-26 ENCOUNTER — Encounter: Payer: Self-pay | Admitting: Neurology

## 2020-09-26 ENCOUNTER — Telehealth: Payer: Self-pay | Admitting: Neurology

## 2020-09-26 DIAGNOSIS — G4733 Obstructive sleep apnea (adult) (pediatric): Secondary | ICD-10-CM | POA: Insufficient documentation

## 2020-09-26 NOTE — Addendum Note (Signed)
Addended by: Melvyn Novas on: 09/26/2020 12:50 PM   Modules accepted: Orders

## 2020-09-26 NOTE — Progress Notes (Signed)
[facility] SLEEP TEST REPORT ( by Watch PAT)   STUDY DATE: 09-20-2020  DOB:  02-15-1985 MRN: [id number]   ORDERING CLINICIAN: Melvyn Novas, MD  REFERRING CLINICIAN: Dr Patsy Lager   CLINICAL INFORMATION/HISTORY:  08/23/2020 from [place], [place]  for a sleep consultation.  Chief concern according to patient :  "My wife reports that I snore and stop breathing"    Jared Blake has a medical history of seasonal and non seasonal respiratory Allergy DM type 2, and non-GERD-Esophagitis, allergic. Sleep relevant medical history: Snoring, allergies, daily medication- no Tonsillectomy but turbinate reduction and deviated septum repair.  Family medical /sleep history: no other family member on CPAP with OSA, insomnia, sleep walkers. His mother is a very loud snorer, and she falls asleep easily- has asthma.    Epworth sleepiness score: 12/24.  BMI: 37.4 kg/m  Neck Circumference: 18"    Sleep Summary:   Total Recording Time (hours, min): Total recording time amounted to 7 hours and 48 minutes.  Total Sleep Time (hours, min): 6 hours and 4 minutes              Percent REM (%): 16.3%.                                      Respiratory Indices:   Calculated pAHI (per hour):   the calculated apnea-hypopnea index was 68.1/h and slightly lower in rem than in non-REM sleep.  REM sleep AHI was 56.6 and normal REM sleep 70.3/h.   We could not obtain snoring data for positional data.                                                                                   Oxygen Saturation Statistics:   O2 Saturation Range (%): The minimum oxygen saturation was 83% with a maximum of 99% and a mean oxygen saturation of 94%.  The total time of O2 Saturation (minutes) <89%: 4.2 minutes or 1.1% of total sleep time.          Pulse Rate Statistics:         Pulse Range: Between 52 bpm and a maximum 122 bpm with a mean heart rate of 76 bpm.  Please note that a home sleep test cannot give [place] data of  cardiac rhythm only cardiac rate.               IMPRESSION:  This HST confirms the presence of severe sleep apnea without REM sleep accentuation.  I do suspect that there may be a positional component however we could not verify these data as the snoring and positional channel did not seem to pick up.    Based on this I would think that this patient needs urgently to start on positive airway pressure therapy this also in light of his reported hypersomnia and daytime.  His delayed waking up in the morning to full alertness, and his frequent naps.   RECOMMENDATION: auto CPAP at 6-18 cm water, 2 cm EPOR and mask of patient's choice and comfort. If a nasal mask  is used, please add chin strap. Heated humidification.     INTERPRETING PHYSICIAN: Melvyn Novas, MD   Medical Director of [facility] at [facility].

## 2020-09-26 NOTE — Telephone Encounter (Signed)
Called patient to discuss sleep study results. No answer at this time. LVM for the patient to call back.  Will send a mychart message as well. 

## 2020-09-26 NOTE — Progress Notes (Signed)
IMPRESSION:  This HST confirms the presence of severe sleep apnea without REM sleep accentuation.  I do suspect that there may be a positional component however we could not verify these data as the snoring and positional channel did not seem to pick up.   Based on these data,  I would start on positive airway pressure therapy (this also in light of his reported hypersomnia, delayed waking up in the morning to full alertness, and his frequent naps). Inspire cannot reduce severe apnea enough   RECOMMENDATION: Starting  auto CPAP at 6-18 cm water pressure , with 2 cm EPR and mask of patient's choice and comfort.  If a nasal mask is used, please add chin strap. Heated humidification.

## 2020-09-26 NOTE — Procedures (Signed)
[facility] SLEEP TEST REPORT ( by Watch PAT)   STUDY DATE: 09-20-2020  DOB:  10-Feb-1985 MRN: [id number]   ORDERING CLINICIAN: Melvyn Novas, MD  REFERRING CLINICIAN: Dr Patsy Lager   CLINICAL INFORMATION/HISTORY:  08/23/2020 from [place], [place]  for a sleep consultation.  Chief concern according to patient :  "My wife reports that I snore and stop breathing"    Jared Blake has a medical history of seasonal and non seasonal respiratory Allergy DM type 2, and non-GERD-Esophagitis, allergic. Sleep relevant medical history: Snoring, allergies, daily medication- no Tonsillectomy but turbinate reduction and status post deviated septum repair.   Family medical /sleep history: no other family member on CPAP with OSA, insomnia, sleep walkers. His mother is a very loud snorer, and she falls asleep easily- has asthma.    Epworth sleepiness score: 12/24.  BMI: 37.4 kg/m  Neck Circumference: 18"    Sleep Summary:  Total Recording Time (hours, min): Total recording time amounted to 7 hours and 48 minutes.  Total Sleep Time (hours, min): 6 hours and 4 minutes              Percent REM (%): 16.3%.                                      Respiratory Indices: Calculated pAHI (per hour):   the calculated apnea-hypopnea index was 68.1/h and slightly lower in rem than in non-REM sleep.  REM sleep AHI was 56.6 and normal REM sleep 70.3/h.   We could not obtain snoring data for positional data.                                                                                   Oxygen Saturation Statistics:   O2 Saturation Range (%): The minimum oxygen saturation was 83% with a maximum of 99% and a mean oxygen saturation of 94%.  The total time of O2 Saturation (minutes) <89%: 4.2 minutes or 1.1% of total sleep time.          Pulse Rate Statistics:         Pulse Range: Between 52 bpm and a maximum 122 bpm with a mean heart rate of 76 bpm.  Please note that a home sleep test cannot give [place] data  of cardiac rhythm only cardiac rate.               IMPRESSION:  This HST confirms the presence of severe sleep apnea without REM sleep accentuation.  I do suspect that there may be a positional component however we could not verify these data as the snoring and positional channel did not seem to pick up.    Based on these data,  I would start on positive airway pressure therapy (this also in light of his reported hypersomnia, delayed waking up in the morning to full alertness, and his frequent naps).  Inspire cannot reduce severe apnea enough    RECOMMENDATION: Starting  auto CPAP at 6-18 cm water pressure , with 2 cm EPR and mask of patient's choice and comfort.  If  a nasal mask is used, please add chin strap. Heated humidification.     INTERPRETING PHYSICIAN: Melvyn Novas, MD   Medical Director of [facility] at [facility].

## 2020-09-26 NOTE — Telephone Encounter (Signed)
-----   Message from Melvyn Novas, MD sent at 09/26/2020 12:49 PM EDT ----- IMPRESSION:  This HST confirms the presence of severe sleep apnea without REM sleep accentuation.  I do suspect that there may be a positional component however we could not verify these data as the snoring and positional channel did not seem to pick up.   Based on these data,  I would start on positive airway pressure therapy (this also in light of his reported hypersomnia, delayed waking up in the morning to full alertness, and his frequent naps). Inspire cannot reduce severe apnea enough   RECOMMENDATION: Starting  auto CPAP at 6-18 cm water pressure , with 2 cm EPR and mask of patient's choice and comfort.  If a nasal mask is used, please add chin strap. Heated humidification.

## 2020-10-03 NOTE — Telephone Encounter (Signed)
Pt returned call. I advised pt that Dr. Vickey Huger reviewed their sleep study results and found that pt has severe sleep apnea. Dr. Vickey Huger recommends that pt starts auto CPAP. I reviewed PAP compliance expectations with the pt. Pt is agreeable to starting a CPAP. I advised pt that an order will be sent to a DME, [facility] , and [facility] will call the pt within about one week after they file with the pt's insurance. [facility]  will show the pt how to use the machine, fit for masks, and troubleshoot the CPAP if needed. A follow up appt was made for insurance purposes with Dr. Vickey Huger on 01/03/2021 at 8:30 am. Pt verbalized understanding to arrive 15 minutes early and bring their CPAP. A letter with all of this information in it will be mailed to the pt as a reminder. I verified with the pt that the address we have on file is correct. Pt verbalized understanding of results. Pt had no questions at this time but was encouraged to call back if questions arise. I have sent the order to [facility]  and have received confirmation that they have received the order.

## 2020-10-05 ENCOUNTER — Encounter: Payer: Self-pay | Admitting: *Deleted

## 2020-10-11 ENCOUNTER — Telehealth: Payer: Self-pay

## 2020-10-11 ENCOUNTER — Encounter: Payer: Self-pay | Admitting: Family Medicine

## 2020-10-11 NOTE — Telephone Encounter (Signed)
Patient called asking if he is eligible for the new covid omicron booster.   His last booster was 11/2019. Please advise on if he can receive.

## 2020-10-14 NOTE — Progress Notes (Addendum)
[facility] [address], [address] [place], [place] [address] [phone]  Date:  10/17/2020   Name:  Jared Blake   DOB:  [birth year]   MRN:  [id number]  PCP:  Pearline Cables, MD    Chief Complaint: Sleep Apnea (Follow up)   History of Present Illness:  Jared Blake is a 35 y.o. 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 mild transaminitis and hyperlipidemia - we started on metformin and encouraged diet and exercise, weight loss efforts Can check on his A1c and lipids today  Metformin is causing some diarrhea I will have him cut down to once a day   He has started working out on a treadmill at home  His wife had some dental work which caused some changes in their diet, and he went to [employer] and to [place] which disrupted his routine.  He will work on gtting back to regular walking  No CP or SOB with exercise  Wt Readings from Last 3 Encounters:  10/17/20 294 lb (133.4 kg)  08/23/20 298 lb (135.2 kg)  06/09/20 (!) 310 lb (140.6 kg)   He did a home sleep study which did show sleep apnea.  They are working on getting him a CPAP machine   Patient Active Problem List   Diagnosis Date Noted   Severe obstructive sleep apnea-hypopnea syndrome 09/26/2020   Morbid obesity (HCC) 08/23/2020   Loud snoring 08/23/2020   Fatigue due to excessive exertion 08/23/2020   Status post nasal surgery 08/23/2020    Past Medical History:  Diagnosis Date   Allergy    Esophagitis     Past Surgical History:  Procedure Laterality Date   bilateral turbinnde reduction      SEPTOPLASTY      Social History   Tobacco Use   Smoking status: Never   Smokeless tobacco: Never  Vaping Use   Vaping Use: Never used  Substance Use Topics   Alcohol use: Yes    Comment: social   Drug use: Never    Family History  Problem Relation Age of Onset   Diabetes Mother    Diabetes Father     No Known Allergies  Medication list has been reviewed and updated.  Current Outpatient Medications on File Prior to Visit  Medication Sig Dispense Refill   cetirizine (ZYRTEC) 10 MG tablet Take 10 mg by mouth daily.     fexofenadine (ALLEGRA) 180 MG tablet 1 tablet every morning     fluticasone (FLONASE) 50 MCG/ACT nasal spray 1-2 spray in each nostril     metFORMIN (GLUCOPHAGE) 500 MG tablet Take 1 tablet (500 mg total) by mouth 2 (two) times daily with a meal. 180 tablet 3   No current facility-administered medications on file prior to visit.    Review of Systems:  As per HPI- otherwise negative.   Physical Examination: Vitals:   10/17/20 0939  BP: 122/72  Pulse: 83  Resp: 17  Temp: (!) 97.2 F (36.2 C)  SpO2: 99%  Vitals:   10/17/20 0939  Weight: 294 lb (133.4 kg)  Height: 6\' 3"  (1.905 m)   Body mass index is 36.75 kg/m. Ideal Body Weight: Weight in (lb) to have BMI = 25: 199.6  GEN: no acute distress.  Obese, looks well  HEENT: Atraumatic, Normocephalic.  Bilateral TM wnl, oropharynx normal.  PEERL,EOMI.   Ears and Nose: No external deformity. CV: RRR, No M/G/R. No JVD. No thrill. No extra heart sounds. PULM: CTA B, no wheezes, crackles, rhonchi. No retractions. No resp. distress. No accessory muscle use. ABD: S, NT, ND, +BS. No rebound. No HSM. EXTR: No c/c/e PSYCH: Normally interactive. Conversant.    Assessment and Plan: Screening for hyperlipidemia - Plan: Lipid panel  Screening for diabetes mellitus - Plan: Comprehensive metabolic panel, Hemoglobin A1c  Class 2 severe obesity due to excess calories with serious comorbidity and body  mass index (BMI) of 36.0 to 36.9 in adult [facility])  Obstructive sleep apnea syndrome  Following up today Will plan further follow- up pending labs. Congratulated on his weight loss so far- keep it up!  Ok to decrease metformin to once a day due to GI SE Keep working on exercise He has a plan to start CPAP   This visit occurred during the SARS-CoV-2 public health emergency.  Safety protocols were in place, including screening questions prior to the visit, additional usage of staff PPE, and extensive cleaning of exam room while observing appropriate contact time as indicated for disinfecting solutions.   Signed [facility], MD  Received his labs as below, message to patient  Results for orders placed or performed in visit on 10/17/20  Comprehensive metabolic panel  Result Value Ref Range   Sodium 138 135 - 145 mEq/L   Potassium 4.4 3.5 - 5.1 mEq/L   Chloride 103 96 - 112 mEq/L   CO2 26 19 - 32 mEq/L   Glucose, Bld 103 (H) 70 - 99 mg/dL   BUN 15 6 - 23 mg/dL   Creatinine, Ser 12/17/20 0.40 - 1.50 mg/dL   Total Bilirubin 0.8 0.2 - 1.2 mg/dL   Alkaline Phosphatase 51 39 - 117 U/L   AST 40 (H) 0 - 37 U/L   ALT 58 (H) 0 - 53 U/L   Total Protein 7.1 6.0 - 8.3 g/dL   Albumin 4.7 3.5 - 5.2 g/dL   GFR 8.29 937.16 mL/min   Calcium 9.9 8.4 - 10.5 mg/dL  Hemoglobin >96.78  Result Value Ref Range   Hgb A1c MFr Bld 6.2 4.6 - 6.5 %  Lipid panel  Result Value Ref Range   Cholesterol 150 0 - 200 mg/dL   Triglycerides L3Y (H) 0.0 - 149.0 mg/dL   HDL 101.7 (L) 51.02 mg/dL   VLDL >58.52 (H) 0.0 - 77.8 mg/dL   Total CHOL/HDL Ratio 5    NonHDL 119.94   LDL cholesterol, direct  Result Value Ref Range   Direct LDL 86.0 mg/dL

## 2020-10-17 ENCOUNTER — Ambulatory Visit: Payer: 59 | Admitting: Family Medicine

## 2020-10-17 ENCOUNTER — Other Ambulatory Visit: Payer: Self-pay

## 2020-10-17 ENCOUNTER — Encounter: Payer: Self-pay | Admitting: Family Medicine

## 2020-10-17 VITALS — BP 122/72 | HR 83 | Temp 97.2°F | Resp 17 | Ht 75.0 in | Wt 294.0 lb

## 2020-10-17 DIAGNOSIS — G4733 Obstructive sleep apnea (adult) (pediatric): Secondary | ICD-10-CM | POA: Diagnosis not present

## 2020-10-17 DIAGNOSIS — Z13220 Encounter for screening for lipoid disorders: Secondary | ICD-10-CM | POA: Diagnosis not present

## 2020-10-17 DIAGNOSIS — Z131 Encounter for screening for diabetes mellitus: Secondary | ICD-10-CM | POA: Diagnosis not present

## 2020-10-17 DIAGNOSIS — Z23 Encounter for immunization: Secondary | ICD-10-CM | POA: Diagnosis not present

## 2020-10-17 DIAGNOSIS — Z6836 Body mass index (BMI) 36.0-36.9, adult: Secondary | ICD-10-CM

## 2020-10-17 LAB — LIPID PANEL
Cholesterol: 150 mg/dL (ref 0–200)
HDL: 29.7 mg/dL — ABNORMAL LOW (ref >39.00)
NonHDL: 119.94
Total CHOL/HDL Ratio: 5
Triglycerides: 235 mg/dL — ABNORMAL HIGH (ref 0.0–149.0)
VLDL: 47 mg/dL — ABNORMAL HIGH (ref 0.0–40.0)

## 2020-10-17 LAB — COMPREHENSIVE METABOLIC PANEL
ALT: 58 U/L — ABNORMAL HIGH (ref 0–53)
AST: 40 U/L — ABNORMAL HIGH (ref 0–37)
Albumin: 4.7 g/dL (ref 3.5–5.2)
Alkaline Phosphatase: 51 U/L (ref 39–117)
BUN: 15 mg/dL (ref 6–23)
CO2: 26 mEq/L (ref 19–32)
Calcium: 9.9 mg/dL (ref 8.4–10.5)
Chloride: 103 mEq/L (ref 96–112)
Creatinine, Ser: 0.95 mg/dL (ref 0.40–1.50)
GFR: 103.76 mL/min (ref >60.00)
Glucose, Bld: 103 mg/dL — ABNORMAL HIGH (ref 70–99)
Potassium: 4.4 mEq/L (ref 3.5–5.1)
Sodium: 138 mEq/L (ref 135–145)
Total Bilirubin: 0.8 mg/dL (ref 0.2–1.2)
Total Protein: 7.1 g/dL (ref 6.0–8.3)

## 2020-10-17 LAB — HEMOGLOBIN A1C: Hgb A1c MFr Bld: 6.2 % (ref 4.6–6.5)

## 2020-10-17 LAB — LDL CHOLESTEROL, DIRECT: Direct LDL: 86 mg/dL

## 2020-10-17 NOTE — Patient Instructions (Signed)
Good to see you again today!  Flu shot given I will be in touch with your labs Ok to cut metformin down to once a day Keep up the great work with weight loss and exercise!   Please see me in 6 months

## 2021-01-03 ENCOUNTER — Encounter: Payer: Self-pay | Admitting: Neurology

## 2021-01-03 ENCOUNTER — Other Ambulatory Visit: Payer: Self-pay

## 2021-01-03 ENCOUNTER — Ambulatory Visit: Payer: 59 | Admitting: Neurology

## 2021-01-03 VITALS — BP 135/83 | HR 75 | Ht 75.0 in | Wt 313.0 lb

## 2021-01-03 DIAGNOSIS — G4733 Obstructive sleep apnea (adult) (pediatric): Secondary | ICD-10-CM

## 2021-01-03 DIAGNOSIS — Z9989 Dependence on other enabling machines and devices: Secondary | ICD-10-CM

## 2021-01-03 NOTE — Patient Instructions (Signed)
Sleep Apnea Sleep apnea is a condition in which breathing pauses or becomes shallow during sleep. People with sleep apnea usually snore loudly. They may have times when they gasp and stop breathing for 10 seconds or more during sleep. This may happen many times during the night. Sleep apnea disrupts your sleep and keeps your body from getting the rest that it needs. This condition can increase your risk of certain health problems, including: Heart attack. Stroke. Obesity. Type 2 diabetes. Heart failure. Irregular heartbeat. High blood pressure. The goal of treatment is to help you breathe normally again. What are the causes? The most common cause of sleep apnea is a collapsed or blocked airway. There are three kinds of sleep apnea: Obstructive sleep apnea. This kind is caused by a blocked or collapsed airway. Central sleep apnea. This kind happens when the part of the brain that controls breathing does not send the correct signals to the muscles that control breathing. Mixed sleep apnea. This is a combination of obstructive and central sleep apnea. What increases the risk? You are more likely to develop this condition if you: Are overweight. Smoke. Have a smaller than normal airway. Are older. Are male. Drink alcohol. Take sedatives or tranquilizers. Have a family history of sleep apnea. Have a tongue or tonsils that are larger than normal. What are the signs or symptoms? Symptoms of this condition include: Trouble staying asleep. Loud snoring. Morning headaches. Waking up gasping. Dry mouth or sore throat in the morning. Daytime sleepiness and tiredness. If you have daytime fatigue because of sleep apnea, you may be more likely to have: Trouble concentrating. Forgetfulness. Irritability or mood swings. Personality changes. Feelings of depression. Sexual dysfunction. This may include loss of interest if you are male, or erectile dysfunction if you are male. How is this  diagnosed? This condition may be diagnosed with: A medical history. A physical exam. A series of tests that are done while you are sleeping (sleep study). These tests are usually done in a sleep lab, but they may also be done at home. How is this treated? Treatment for this condition aims to restore normal breathing and to ease symptoms during sleep. It may involve managing health issues that can affect breathing, such as high blood pressure or obesity. Treatment may include: Sleeping on your side. Using a decongestant if you have nasal congestion. Avoiding the use of depressants, including alcohol, sedatives, and narcotics. Losing weight if you are overweight. Making changes to your diet. Quitting smoking. Using a device to open your airway while you sleep, such as: An oral appliance. This is a custom-made mouthpiece that shifts your lower jaw forward. A continuous positive airway pressure (CPAP) device. This device blows air through a mask when you breathe out (exhale). A nasal expiratory positive airway pressure (EPAP) device. This device has valves that you put into each nostril. A bi-level positive airway pressure (BIPAP) device. This device blows air through a mask when you breathe in (inhale) and breathe out (exhale). Having surgery if other treatments do not work. During surgery, excess tissue is removed to create a wider airway. Follow these instructions at home: Lifestyle Make any lifestyle changes that your health care provider recommends. Eat a healthy, well-balanced diet. Take steps to lose weight if you are overweight. Avoid using depressants, including alcohol, sedatives, and narcotics. Do not use any products that contain nicotine or tobacco. These products include cigarettes, chewing tobacco, and vaping devices, such as e-cigarettes. If you need help quitting, ask your  health care provider. General instructions Take over-the-counter and prescription medicines only as told  by your health care provider. If you were given a device to open your airway while you sleep, use it only as told by your health care provider. If you are having surgery, make sure to tell your health care provider you have sleep apnea. You may need to bring your device with you. Keep all follow-up visits. This is important. Contact a health care provider if: The device that you received to open your airway during sleep is uncomfortable or does not seem to be working. Your symptoms do not improve. Your symptoms get worse. Get help right away if: You develop: Chest pain. Shortness of breath. Discomfort in your back, arms, or stomach. You have: Trouble speaking. Weakness on one side of your body. Drooping in your face. These symptoms may represent a serious problem that is an emergency. Do not wait to see if the symptoms will go away. Get medical help right away. Call your local emergency services (911 in the [place] Do not drive yourself to the hospital. Summary Sleep apnea is a condition in which breathing pauses or becomes shallow during sleep. The most common cause is a collapsed or blocked airway. The goal of treatment is to restore normal breathing and to ease symptoms during sleep. This information is not intended to replace advice given to you by your health care provider. Make sure you discuss any questions you have with your health care provider. Document Revised: 08/31/2020 Document Reviewed: 01/01/2020 Elsevier Patient Education  2022 [employer]. Fatigue If you have fatigue, you feel tired all the time and have a lack of energy or a lack of motivation. Fatigue may make it difficult to start or complete tasks because of exhaustion. In general, occasional or mild fatigue is often a normal response to activity or life. However, long-lasting (chronic) or extreme fatigue may be a symptom of a medical condition. Follow these instructions at home: General instructions Watch your  fatigue for any changes. Go to bed and get up at the same time every day. Avoid fatigue by pacing yourself during the day and getting enough sleep at night. Maintain a healthy weight. Medicines Take over-the-counter and prescription medicines only as told by your health care provider. Take a multivitamin, if told by your health care provider.  Do not use herbal or dietary supplements unless they are approved by your health care provider. Activity  Exercise regularly, as told by your health care provider. Use or practice techniques to help you relax, such as yoga, tai chi, meditation, or massage therapy. Eating and drinking  Avoid heavy meals in the evening. Eat a well-balanced diet, which includes lean proteins, whole grains, plenty of fruits and vegetables, and low-fat dairy products. Avoid consuming too much caffeine. Avoid the use of alcohol. Drink enough fluid to keep your urine pale yellow. Lifestyle Change situations that cause you stress. Try to keep your work and personal schedule in balance. Do not use any products that contain nicotine or tobacco, such as cigarettes and e-cigarettes. If you need help quitting, ask your health care provider. Do not use drugs. Contact a health care provider if: Your fatigue does not get better. You have a fever. You suddenly lose or gain weight. You have headaches. You have trouble falling asleep or sleeping through the night. You feel angry, guilty, anxious, or sad. You are unable to have a bowel movement (constipation). Your skin is dry. You have swelling in your  legs or another part of your body. Get help right away if: You feel confused. Your vision is blurry. You feel faint or you pass out. You have a severe headache. You have severe pain in your abdomen, your back, or the area between your waist and hips (pelvis). You have chest pain, shortness of breath, or an irregular or fast heartbeat. You are unable to urinate, or you  urinate less than normal. You have abnormal bleeding, such as bleeding from the rectum, vagina, nose, lungs, or nipples. You vomit blood. You have thoughts about hurting yourself or others. If you ever feel like you may hurt yourself or others, or have thoughts about taking your own life, get help right away. You can go to your nearest emergency department or call: Your local emergency services (911 in the [place] A suicide crisis helpline, such as the [place] at 507-084-8800 or 988 in the [place]. This is open 24 hours a day. Summary If you have fatigue, you feel tired all the time and have a lack of energy or a lack of motivation. Fatigue may make it difficult to start or complete tasks because of exhaustion. Long-lasting (chronic) or extreme fatigue may be a symptom of a medical condition. Exercise regularly, as told by your health care provider. Change situations that cause you stress. Try to keep your work and personal schedule in balance. This information is not intended to replace advice given to you by your health care provider. Make sure you discuss any questions you have with your health care provider. Document Revised: 08/17/2020 Document Reviewed: 12/03/2019 Elsevier Patient Education  2022 [employer].

## 2021-01-03 NOTE — Progress Notes (Signed)
SLEEP MEDICINE CLINIC    Provider:  Melvyn Novas, MD  Primary Care Physician:  Pearline Cables, MD [address] [place] [address]     Referring Provider: Pearline Cables, Md [address] [place],  [place] [address]          Chief Complaint according to patient   Patient presents with:     New Patient (Initial Visit)      Pt presents today for ongoing concerns potential osa. Never had a SS. Wife has witnessed apnea events and snoring for a long time. Avg 6-8 hrs of sleep andstates may get up 1 time to void      HISTORY OF PRESENT ILLNESS:  Jared Blake is a 35 y.o. year old Caucasian male patient seen here in a RV on 01-03-2021. Mr. Shimabukuro reports that he was not surprised that his sleep test showed an AHI of almost 30 of 68.1/h which would equal 1 event a minute of sleep or more.  Interestingly the AHI was a little bit lower and rapid eye movement sleep and in the nonrapid eye movement sleep phases.  REM sleep AHI was 56.6 and normal non-REM 70.3.  Oxygen saturation at nadir was 83% which is not critical, heart rate was in normal range.  Auto CPAP was initiated between 6 and 18 cmH2O pressure was 2 cm expiratory pressure relief and a mask of patient's comfort.  He did humidification was to be provided.  He did not have snoring data or positional data.  The patient was given a CPAP machine by [facility].  The data I am looking at today and compares 11/23/2020 with the end date of 12/31/2020.  The patient has used the machine 100% of these for 40 days.  And on average 6 hours and 43 minutes at night.  His residual AHI is 0.4 which is the best possible outcome.  He is using a Careers adviser.  Allowing him to sleep on the sides.  Further interesting is that there are no central apneas arising and that the average air leak is extremely low.  So based on these data the patient also confirms that he has no problems to fall asleep at night he  stays asleep, is less restless.  His spouse has been happy that he is snoring less.   He may have 1 nocturia at night.  He feels that he can stay awake longer he has only once inadvertently falling asleep in the evening hours before he wanted to go to bed.  So this is a decrease in daytime sleepiness that is quite significant. Hba1c has dropped to normal, he lost 15 pounds on metformin.          Below is the original visit from Copland, MD  for a sleep consultation.  Chief concern according to patient :  "My wife reports I snore and stop breathing"    I have the pleasure of seeing Jared Blake today, a right-handed Caucasian male with a possible sleep disorder.   He has a past medical history of seasonal and non seasonal respiratory Allergy DM type 2, and non-GERD-Esophagitis, allergic.    Sleep relevant medical history: Snoring, allergies, daily medication- no Tonsillectomy but turbinate reduction and deviated septum repair.   Family medical /sleep history: no other family member on CPAP with OSA, insomnia, sleep walkers. His mother is a very loud snorer, and she falls asleep easily- has  asthma.    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 The patient currently works remote. Tobacco VHQ:IONG ., grew up with passive smoke exposure-   ETOH use; rare  ,  Caffeine intake in form of Coffee( /) Soda( rare ) Tea ( /) no energy drinks. Regular exercise in form of  walking .     Sleep habits are as follows: The patient's dinner time is between 7-8 PM. The patient goes to bed at 12 PM and continues to sleep for an average of 7 hours, wakes for one bathroom break. The preferred sleep position is non supine, left -no TV in bed-, with the support of 1 pillow. Dreams are reportedly rare   8.15AM is the usual rise time. The patient wakes up with an alarm- he snoozes the alarm  frequently.  He reports not feeling refreshed or restored in AM, with symptoms such as dry mouth, and residual fatigue.  It takes until noon to completely wake up! Naps are taken infrequently, lasting from 30 to 45 minutes and he may sleep in front of TV-  are more refreshing than nocturnal sleep.    Review of Systems: Out of a complete 14 system review, the patient complains of only the following symptoms, and all other reviewed systems are negative.:  Fatigue, sleepiness , snoring, fragmented sleep, Insomnia.  Wife has leukemia- chronic fatigue- mast cell activation syndrome.    How likely are you to doze in the following situations: 0 = not likely, 1 = slight chance, 2 = moderate chance, 3 = high chance   Sitting and Reading? Watching Television? Sitting inactive in a public place (theater or meeting)? As a passenger in a car for an hour without a break? Lying down in the afternoon when circumstances permit? Sitting and talking to someone? Sitting quietly after lunch without alcohol? In a car, while stopped for a few minutes in traffic?   Total = pre CPAP 12/ 24 points . Post CPAP 10/ 24   FSS was pre CPAP endorsed at 48/ 63 points.  Fatigue post CPAP is reduced to  22 /63 points.  10 AM is his most fatigued time. No caffeine.  1) small jaw, high grade Mallampati, elevated neck size and BMI.  Long time snorer, since teenage- worsening with slow chronic weight gain over a decade.  2) DM 3) high risk of OSA.  4) had no COVID yet- vaccinated- wife has leukemia.   Social History   Socioeconomic History   Marital status: Married    Spouse name: Not on file   Number of children: Not on file   Years of education: Not on file   Highest education level: Not on file  Occupational History   Not on file  Tobacco Use   Smoking status: Never   Smokeless tobacco: Never  Vaping Use   Vaping Use: Never used  Substance and Sexual Activity   Alcohol use: Yes    Comment: social    Drug use: Never   Sexual activity: Not on file  Other Topics Concern   Not on file  Social History Narrative   Not on file   Social Determinants of Health   Financial Resource Strain: Not on file  Food Insecurity: Not on file  Transportation Needs: Not on file  Physical Activity: Not on file  Stress: Not on file  Social Connections: Not  on file    Family History  Problem Relation Age of Onset   Diabetes Mother    Diabetes Father     Past Medical History:  Diagnosis Date   Allergy    Esophagitis     Past Surgical History:  Procedure Laterality Date   bilateral turbinnde reduction     SEPTOPLASTY       Current Outpatient Medications on File Prior to Visit  Medication Sig Dispense Refill   cetirizine (ZYRTEC) 10 MG tablet Take 10 mg by mouth daily.     fexofenadine (ALLEGRA) 180 MG tablet 1 tablet every morning     fluticasone (FLONASE) 50 MCG/ACT nasal spray 1-2 spray in each nostril     metFORMIN (GLUCOPHAGE) 500 MG tablet Take 1 tablet (500 mg total) by mouth 2 (two) times daily with a meal. (Patient taking differently: Take 500 mg by mouth daily with breakfast.) 180 tablet 3   No current facility-administered medications on file prior to visit.    No Known Allergies  Physical exam:  Today's Vitals   01/03/21 0823  BP: 135/83  Pulse: 75  Weight: (!) 313 lb (142 kg)  Height: 6\' 3"  (1.905 m)   Body mass index is 39.12 kg/m.   Wt Readings from Last 3 Encounters:  01/03/21 (!) 313 lb (142 kg)  10/17/20 294 lb (133.4 kg)  08/23/20 298 lb (135.2 kg)     Ht Readings from Last 3 Encounters:  01/03/21 6\' 3"  (1.905 m)  10/17/20 6\' 3"  (1.905 m)  08/23/20 6\' 3"  (1.905 m)      General: The patient is awake, alert and appears not in acute distress. The patient is well groomed. Head: Normocephalic, atraumatic. Neck is supple. Mallampati 2-3,  neck circumference:18.25 inches . Nasal airflow patent.  Retrognathia is seen.  Dental status: some crowding, small  jaw-and facial hair- Cardiovascular:  Regular rate and cardiac rhythm by pulse,  without distended neck veins. Respiratory: Lungs are clear to auscultation.  Skin:  Without evidence of ankle edema, or rash. Trunk: The patient's posture is erect.   Neurologic exam : The patient is awake and alert, oriented to place and time.   Memory subjective described as intact.  Attention span & concentration ability appears normal.  Speech is fluent,  without  dysarthria, dysphonia or aphasia.  Mood and affect are appropriate.   Cranial nerves: no loss of smell or taste reported  Pupils are equal and briskly reactive to light. Funduscopic exam deferred.  Extraocular movements in vertical and horizontal planes were intact and without nystagmus. Visual fields by finger perimetry are intact. Hearing was intact to soft voice and finger rubbing.    Facial sensation intact to fine touch.  Facial motor strength is symmetric and tongue and uvula move midline.  Neck ROM : rotation, tilt and flexion extension were normal for age and shoulder shrug was symmetrical.    Motor exam:  /   Sensory:  /   Coordination: Rapid alternating movements in the fingers/hands were of normal speed.  The Finger-to-nose maneuver was intact without evidence of ataxia, dysmetria or tremor. Gait and station: Patient could rise unassisted from a seated position, walked without assistive device.  Stance is of normal width/ base and the patient turned with 3 steps.  Toe and heel walk were deferred.  Deep tendon reflexes: in the upper and lower extremities are symmetrically attenuated- and intact.  Babinski response was deferred       After spending a total time of  20 minutes face to face and additional time for physical and neurologic examination, review of laboratory studies,  personal review of imaging studies, reports and results of other testing and review of referral information / records as far as provided in visit, I have  established the following assessments:  MR. Mr. Yeates also reports that when he had his nasal surgery he was staying in a recovery and observation area for a prolonged period of time as his sleep apnea interfered with the extubation.   My Plan is to proceed with:  1) HST confirmed OSA severe!  CPAP was initiated and is well tolerated , no need to change settings, highly compliant. See above note from 01-03-2021.  Less fatigue, less sleepiness, and no more snoring!   I would like to thank Copland, Gwenlyn Found, MD / [address] [place], [place] [address] for allowing me to meet with and to take care of this pleasant patient.   In short, JI FELDNER is presenting with OSA, a symptom that can be attributed to weight and airway anatomy.   I plan to follow up either personally or through our NP within 12 month.   CC: I will share my notes with PCP.Marland Kitchen  Electronically signed by: Melvyn Novas, MD 01/03/2021 8:33 AM  [facility] Board certified by The [employer] of Sleep Medicine and Diplomate of the [employer] of Sleep Medicine. Board certified In Neurology through the ABPN, Fellow of the [employer] of Neurology. Medical Director of [facility].

## 2021-01-27 NOTE — Progress Notes (Deleted)
[facility] [address], [address] [place], [place] [address] [phone]  Date:  02/02/2021   Name:  Jared Blake   DOB:  October 16, 1985   MRN:  [id number]  PCP:  Pearline Cables, MD    Chief Complaint: No chief complaint on file.   History of Present Illness:  Jared Blake is a 35 y.o. very pleasant male patient who presents with the following:  Pt seen today with concern of ear pain Last visit with myself was in September  History of obesity, OSA, allergies- he is doing immunotherapy   Covid booster  Patient Active Problem List   Diagnosis Date Noted   OSA on CPAP 01/03/2021   Severe obstructive sleep apnea-hypopnea syndrome 09/26/2020   Morbid obesity (HCC) 08/23/2020   Loud snoring 08/23/2020   Status post nasal surgery 08/23/2020    Past Medical History:  Diagnosis Date   Allergy    Esophagitis     Past Surgical History:  Procedure Laterality Date   bilateral turbinnde reduction     SEPTOPLASTY      Social History   Tobacco Use   Smoking status: Never   Smokeless tobacco: Never  Vaping Use   Vaping Use: Never used  Substance Use Topics   Alcohol use: Yes    Comment: social   Drug use: Never    Family History  Problem Relation Age of Onset   Diabetes Mother    Diabetes Father     No Known Allergies  Medication list has been reviewed and updated.  Current Outpatient Medications on File Prior to Visit  Medication Sig Dispense Refill   cetirizine (ZYRTEC) 10 MG tablet Take 10 mg by mouth daily.     fexofenadine (ALLEGRA) 180 MG tablet 1 tablet every morning     fluticasone (FLONASE) 50 MCG/ACT nasal spray 1-2 spray in each nostril     metFORMIN (GLUCOPHAGE) 500 MG tablet Take 1 tablet (500 mg total) by mouth 2 (two) times daily with a meal. (Patient taking differently: Take 500 mg by mouth daily with breakfast.) 180 tablet 3   No current facility-administered medications on file  prior to visit.    Review of Systems:  As per HPI- otherwise negative.   Physical Examination: There were no vitals filed for this visit. There were no vitals filed for this visit. There is no height or weight on file to calculate BMI. Ideal Body Weight:    GEN: no acute distress. HEENT: Atraumatic, Normocephalic.  Ears and Nose: No external deformity. CV: RRR, No M/G/R. No JVD. No thrill. No extra heart sounds. PULM: CTA B, no wheezes, crackles, rhonchi. No retractions. No resp. distress. No accessory muscle use. ABD: S, NT, ND, +BS. No rebound. No HSM. EXTR: No c/c/e PSYCH: Normally interactive. Conversant.    Assessment and Plan: ***  Signed Abbe Amsterdam, MD

## 2021-02-02 ENCOUNTER — Ambulatory Visit: Payer: 59 | Admitting: Family Medicine

## 2021-02-03 ENCOUNTER — Encounter: Payer: Self-pay | Admitting: Family Medicine

## 2021-02-03 ENCOUNTER — Ambulatory Visit: Payer: 59 | Admitting: Family Medicine

## 2021-02-03 VITALS — BP 132/79 | HR 105 | Temp 98.2°F | Ht 75.5 in | Wt 310.4 lb

## 2021-02-03 DIAGNOSIS — H6983 Other specified disorders of Eustachian tube, bilateral: Secondary | ICD-10-CM | POA: Diagnosis not present

## 2021-02-03 MED ORDER — PREDNISONE 20 MG PO TABS: 40.0000 mg | ORAL_TABLET | Freq: Every day | ORAL | 0 refills | Status: AC

## 2021-02-03 NOTE — Patient Instructions (Addendum)
OK to use Debrox (peroxide) in the ear to loosen up wax. Also recommend using a bulb syringe (for removing boogers from baby's noses) to flush through warm water and vinegar (3-4:1 ratio). An alternative, though more expensive, is an elephant ear washer wax removal kit. Do not use Q-tips as this can impact wax further.  Continue Flonase.  If symptoms fail to improve or worsen, please take the prednisone.   Let us know if you need anything.

## 2021-02-03 NOTE — Progress Notes (Signed)
Chief Complaint  Patient presents with   Ear Pain    Pt is here for right ear pain. Duration: 2 weeks; resolved, wants to make sure he didn't perf TM Progression: better Associated symptoms: Chronic congestion Denies: sore throat, fevers, sneezing, and ear pressure, bleeding, or discharge from ear Treatment to date: Uses Q tips, INCS daily  Past Medical History:  Diagnosis Date   Allergy    Esophagitis     BP 132/79    Pulse (!) 105    Temp 98.2 F (36.8 C) (Oral)    Ht 6' 3.5" (1.918 m)    Wt (!) 310 lb 6 oz (140.8 kg)    SpO2 97%    BMI 38.28 kg/m  General: Awake, alert, appearing stated age HEENT:  L ear- Canal patent without drainage or erythema, TM is retracted R ear- canal patent without drainage or erythema, TM is slightly retracted Nose- nares patent and without discharge Mouth- Lips, gums and dentition unremarkable, pharynx is without erythema or exudate Neck: No adenopathy Lungs: Normal effort, no accessory muscle use Psych: Age appropriate judgment and insight, normal mood and affect  Dysfunction of both eustachian tubes - Plan: predniSONE (DELTASONE) 20 MG tablet  Cont INCS. If s/s's worsen or fail to improve, OK to use 5 d pred burst 40 mg/d.  F/u prn.  Pt voiced understanding and agreement to the plan.  Jilda Roche Harold, DO 02/03/21 1:03 PM

## 2021-05-28 IMAGING — CT CT ABD-PELV W/ CM
1 of 2 series · 14 of 32 positions shown, 19 images · IV contrast (APPLIED)
Comparison: None.

CLINICAL DATA: Onset right lower quadrant pain 04/01/2020.

EXAM:
CT ABDOMEN AND PELVIS WITH CONTRAST
TECHNIQUE: Multidetector CT imaging of the abdomen and pelvis was performed
using the standard protocol following bolus administration of
intravenous contrast.
CONTRAST:  100 mL [id number] IOPAMIDOL ([id number]) INJECTION 61%

[Series 2: abd/pelvis w/cm · axial · 0.94mm/px · z∈[-520,-15]mm · 14 of 113 slices shown, 19 images]
[im 6/113  soft-tissue]
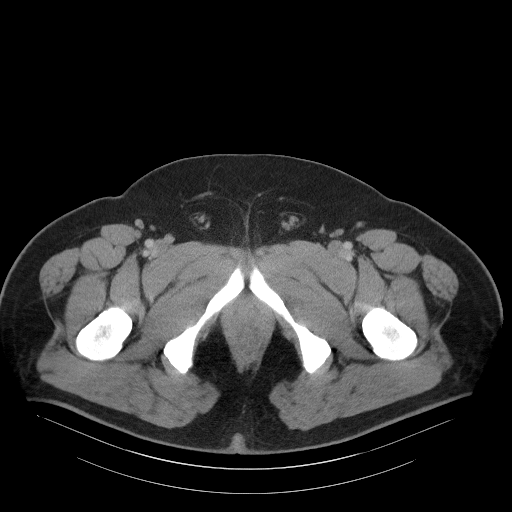
[im 6/113  bone]
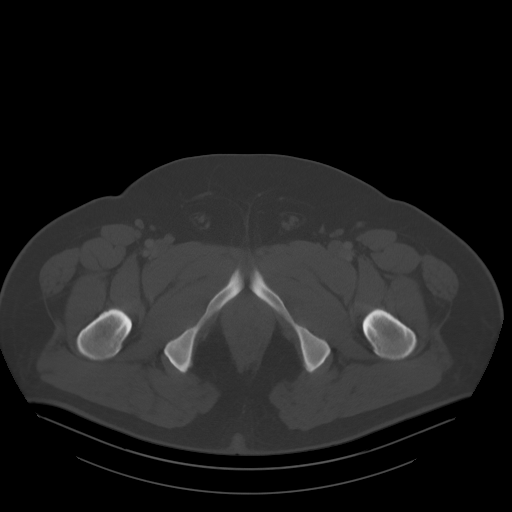
[im 18/113  soft-tissue]
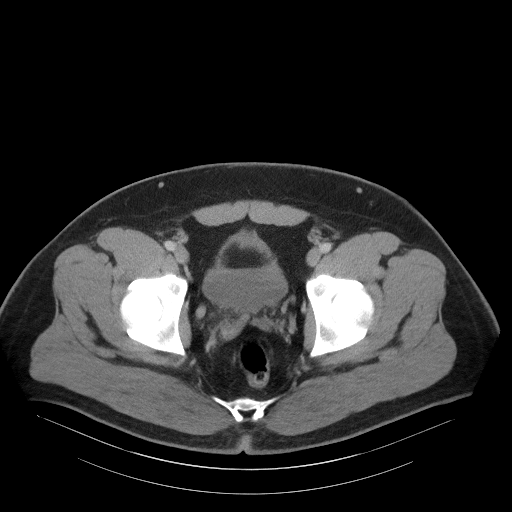
[im 24/113  soft-tissue]
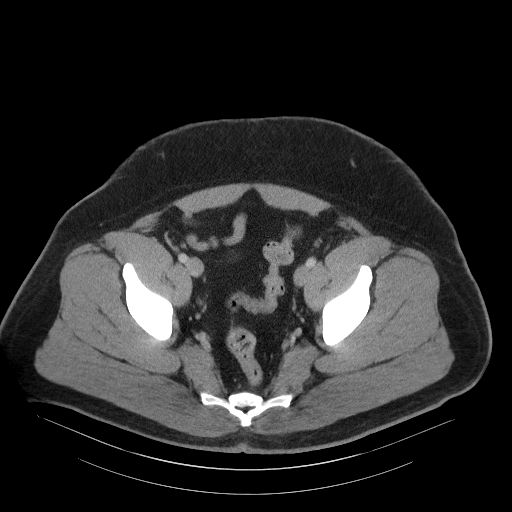
[im 30/113  soft-tissue]
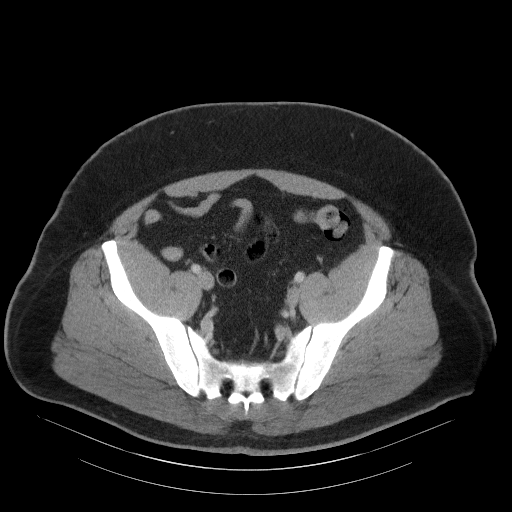
[im 42/113  soft-tissue]
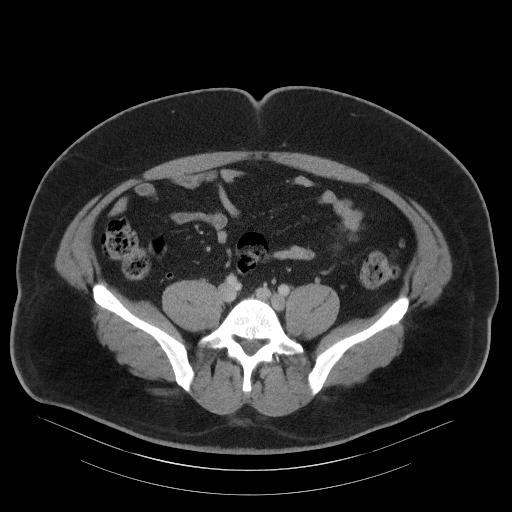
[im 48/113  soft-tissue]
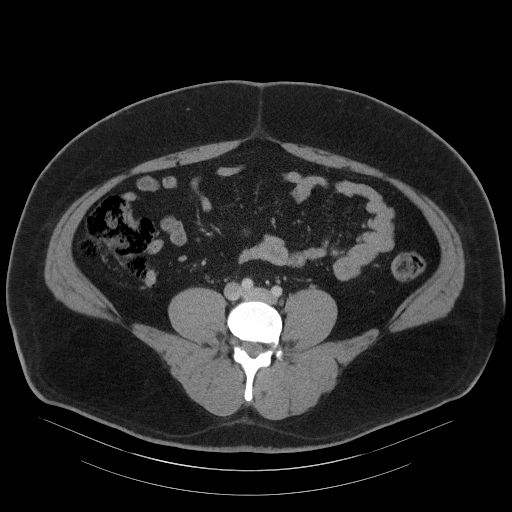
[im 59/113  soft-tissue]
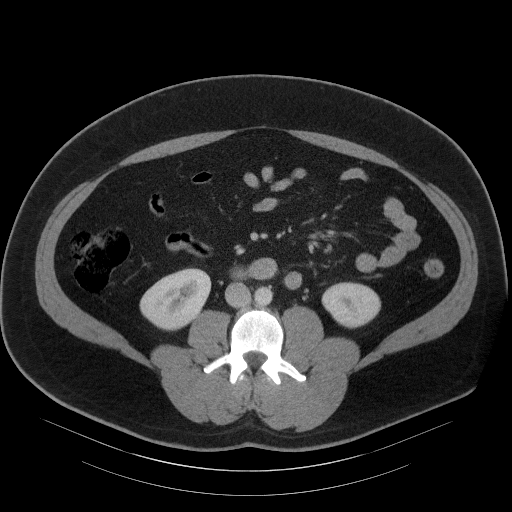
[im 65/113  soft-tissue]
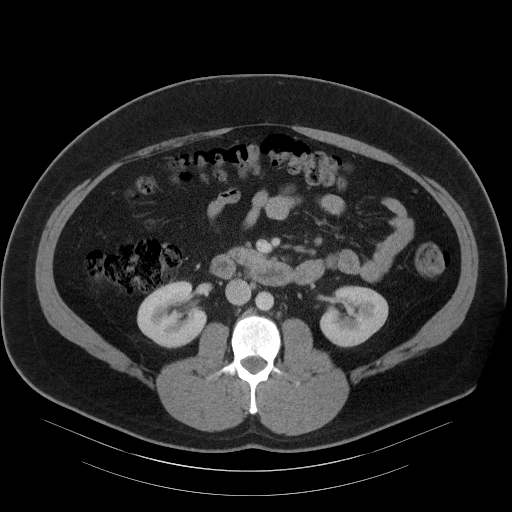
[im 71/113  soft-tissue]
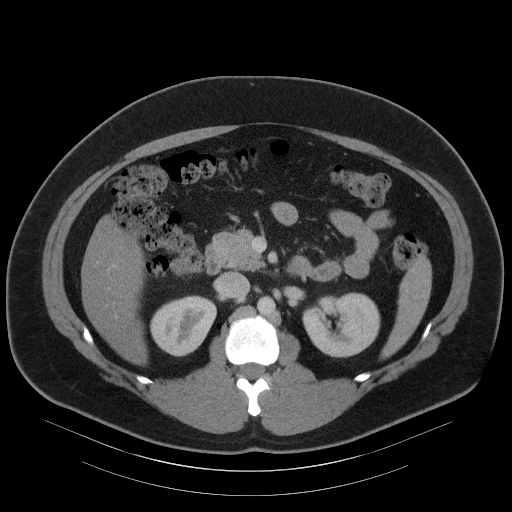
[im 71/113  bone]
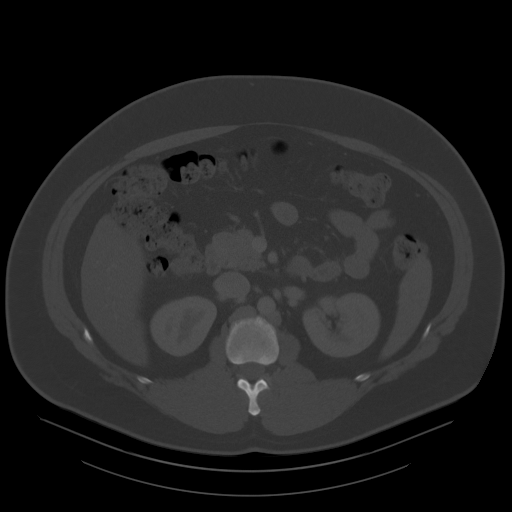
[im 83/113  soft-tissue]
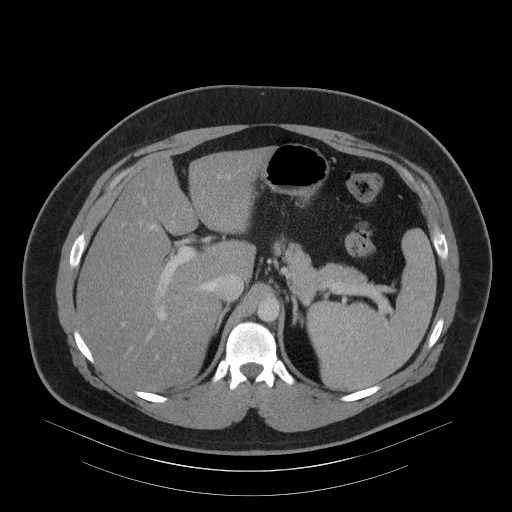
[im 89/113  soft-tissue]
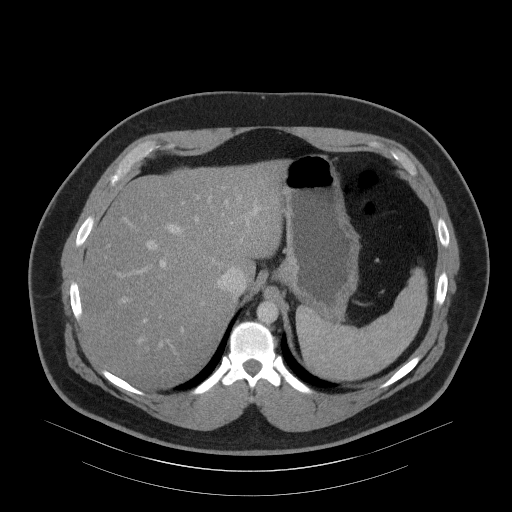
[im 89/113  lung]
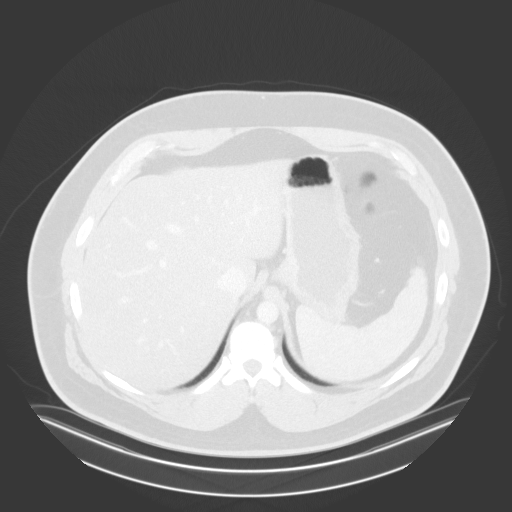
[im 95/113  soft-tissue]
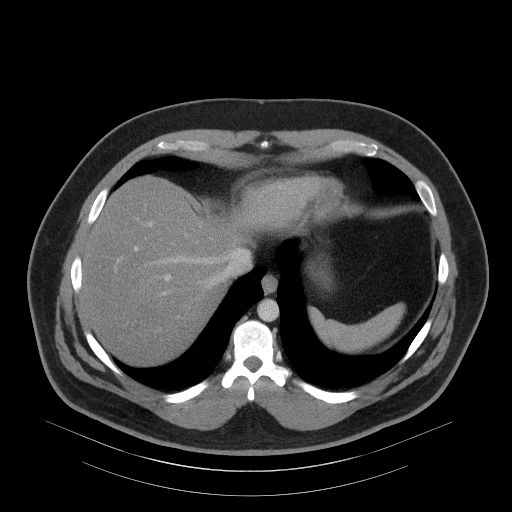
[im 95/113  lung]
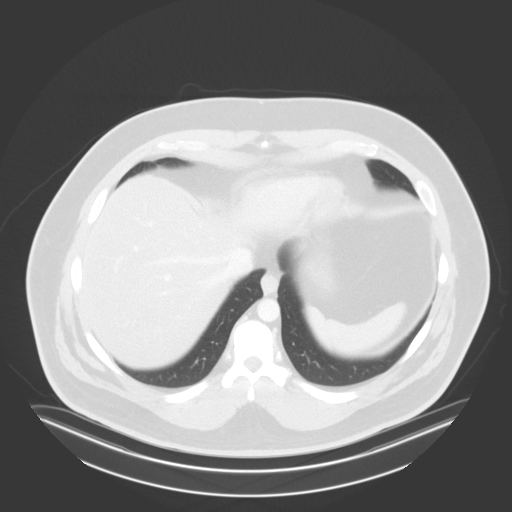
[im 101/113  lung]
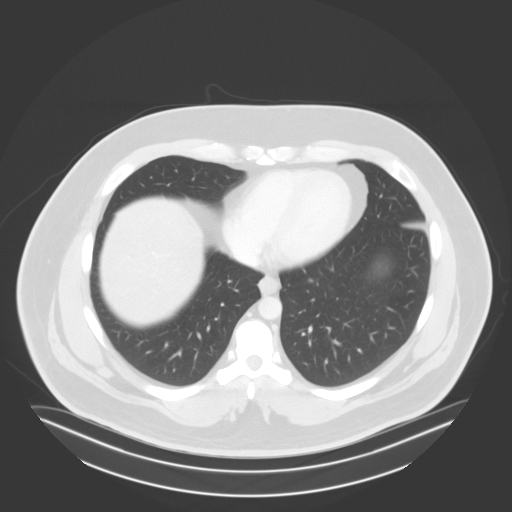
[im 107/113  soft-tissue]
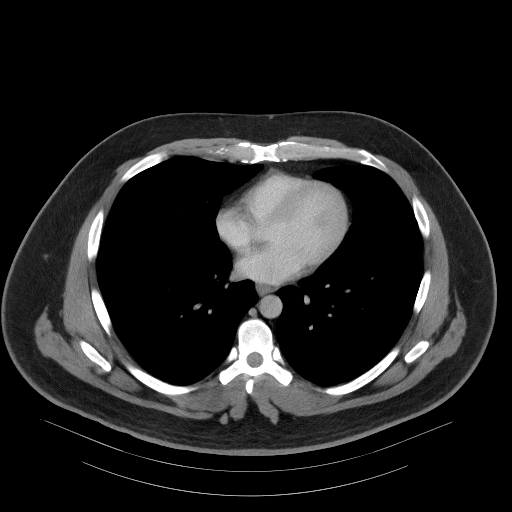
[im 107/113  lung]
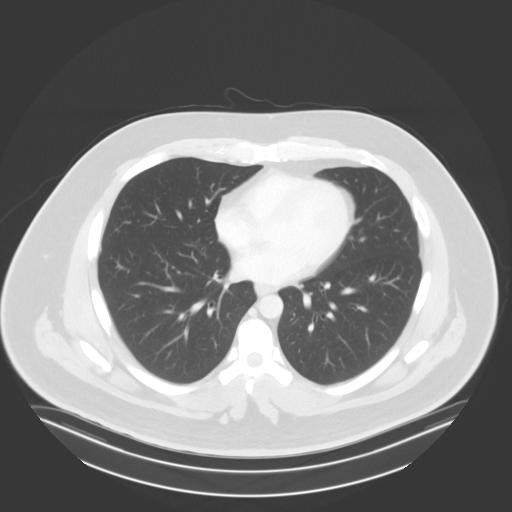

[14 of 32 positions shown; findings below may reference images not displayed]

FINDINGS: Lower chest: Lung bases clear.  No pleural or pericardial effusion.

Hepatobiliary: No focal liver abnormality is seen. No gallstones,
gallbladder wall thickening, or biliary dilatation. The liver is
diffusely low attenuating consistent with fatty infiltration.

Pancreas: Unremarkable. No pancreatic ductal dilatation or
surrounding inflammatory changes.

Spleen: Normal in size without focal abnormality.

Adrenals/Urinary Tract: Adrenal glands are unremarkable. Kidneys are
normal, without renal calculi, focal lesion, or hydronephrosis.
Bladder is unremarkable.

Stomach/Bowel: Stomach is within normal limits. Appendix appears
normal. No evidence of bowel wall thickening, distention, or
inflammatory changes.

Vascular/Lymphatic: No significant vascular findings are present. No
enlarged abdominal or pelvic lymph nodes.

Reproductive: Prostate is unremarkable.

Other: Small fat containing umbilical hernia is noted.

Musculoskeletal: Negative.
IMPRESSION: No acute abnormality or finding to explain the patient's symptoms.
Negative for appendicitis.

Fatty infiltration of the liver.

Small fat containing umbilical hernia.

## 2021-09-20 ENCOUNTER — Encounter: Payer: 59 | Admitting: Family Medicine

## 2021-09-21 DIAGNOSIS — R7303 Prediabetes: Secondary | ICD-10-CM | POA: Insufficient documentation

## 2021-09-21 DIAGNOSIS — E1165 Type 2 diabetes mellitus with hyperglycemia: Secondary | ICD-10-CM | POA: Insufficient documentation

## 2021-09-21 NOTE — Patient Instructions (Addendum)
It was good to see you today, I will be in touch with your labs Let's try the metformin XR to reduce your GI side effects- if you like, wait until we get your A1c back in case we want to try twice a day   Ok to do a covid booster this fall and your flu shot!

## 2021-09-21 NOTE — Progress Notes (Signed)
[facility] [address] [place], [place] [address] [phone]  Date:  09/25/2021   Name:  Jared Blake   DOB:  [birth year]   MRN:  [id number]  PCP:  Pearline Cables, MD    Chief Complaint: Annual Exam (Concerns/ questions: none/Hep C/ HIV screen due)   History of Present Illness:  Jared Blake is a 36 y.o. very pleasant male patient who presents with the following:  Patient seen today for physical exam-history of obesity, sleep apnea, prediabetes Last seen by myself about 1 year ago  He works at [employer]- he is working from home full time  Prediabetes; he had one A1c of 7.4%, improved to 6.2% last year with metformin He is tolerating this pretty well-  Most recent labs 1 year ago He is currently undergoing immunotherapy per allergy- this does seem to be helping He does use CPAP and it helps him- he is less somnolent as long as he sticks with his machine  Metformin 500 once daily- he would like to try the ER due to GI side effects.  These are tolerable but bothersome  Married to Bouton who is also my patient  He did have an uncle with prostate cancer-otherwise no particular family history of cancer He is walking for exercise- treadmill- about 4x a week for an hour  No chest pain or shortness of breath with exercise Never a smoker  His wife Orlie Pollen is  immunocompromised, he does plan to have his flu shot and COVID booster this fall  Wt Readings from Last 3 Encounters:  09/25/21 (!) 304 lb (137.9 kg)  02/03/21 (!) 310 lb 6 oz (140.8 kg)  01/03/21 (!) 313 lb (142 kg)     Patient Active Problem List   Diagnosis Date Noted   Prediabetes 09/21/2021   OSA on CPAP 01/03/2021   Severe obstructive sleep apnea-hypopnea syndrome 09/26/2020   Morbid obesity (HCC) 08/23/2020   Loud snoring 08/23/2020   Status post nasal surgery 08/23/2020    Past Medical History:  Diagnosis Date   Allergy    Esophagitis      Past Surgical History:  Procedure Laterality Date   bilateral turbinnde reduction     SEPTOPLASTY      Social History   Tobacco Use   Smoking status: Never   Smokeless tobacco: Never  Vaping Use   Vaping Use: Never used  Substance Use Topics   Alcohol use: Yes    Comment: social   Drug use: Never    Family History  Problem Relation Age of Onset   Diabetes Mother    Diabetes Father     No Known Allergies  Medication list has been reviewed and updated.  Current Outpatient Medications on File Prior to Visit  Medication Sig Dispense Refill   cetirizine (ZYRTEC) 10 MG tablet Take 10 mg by mouth daily.     fexofenadine (ALLEGRA) 180 MG tablet 1 tablet every morning     fluticasone (FLONASE) 50 MCG/ACT nasal spray 1-2 spray in each nostril     No current facility-administered medications on file prior to visit.    Review of Systems:  As per HPI- otherwise negative.   Physical Examination: Vitals:   09/25/21 1051  BP: 110/64  Pulse: 78  Resp: 18  Temp: 97.6 F (36.4 C)  SpO2: 97%   Vitals:   09/25/21 1051  Weight: (!) 304 lb (137.9 kg)  Height: 6' 3.5" (1.918  m)   Body mass index is 37.5 kg/m. Ideal Body Weight: Weight in (lb) to have BMI = 25: 202.3  GEN: no acute distress. Obese, looks well  HEENT: Atraumatic, Normocephalic.  Ears and Nose: No external deformity. CV: RRR, No M/G/R. No JVD. No thrill. No extra heart sounds. PULM: CTA B, no wheezes, crackles, rhonchi. No retractions. No resp. distress. No accessory muscle use. ABD: S, NT, ND, +BS. No rebound. No HSM. EXTR: No c/c/e PSYCH: Normally interactive. Conversant.    Assessment and Plan: Physical exam  Prediabetes - Plan: Comprehensive metabolic panel, Hemoglobin A1c, metFORMIN (GLUCOPHAGE-XR) 500 MG 24 hr tablet  Obstructive sleep apnea syndrome  Screening for hyperlipidemia - Plan: Lipid panel  Screening for deficiency anemia - Plan: CBC  Screening for thyroid disorder - Plan:  TSH  Physical exam today.  Encouraged healthy diet and exercise routine Will plan further follow- up pending labs. He currently carries diagnosis of prediabetes as he only had 1 A1c greater than 6.5%.  We will change him to metformin extended release in hopes of reducing side effects.  Plan for 500 mg once a day but can increase if needed Will plan further follow- up pending labs.   Signed Abbe Amsterdam, MD  Received labs as below, message to patient Results for orders placed or performed in visit on 09/25/21  CBC  Result Value Ref Range   WBC 7.8 4.0 - 10.5 K/uL   RBC 4.96 4.22 - 5.81 Mil/uL   Platelets 150.0 150.0 - 400.0 K/uL   Hemoglobin 14.4 13.0 - 17.0 g/dL   HCT 75.6 43.3 - 29.5 %   MCV 86.1 78.0 - 100.0 fl   MCHC 33.7 30.0 - 36.0 g/dL   RDW 18.8 41.6 - 60.6 %  Comprehensive metabolic panel  Result Value Ref Range   Sodium 138 135 - 145 mEq/L   Potassium 4.5 3.5 - 5.1 mEq/L   Chloride 102 96 - 112 mEq/L   CO2 27 19 - 32 mEq/L   Glucose, Bld 169 (H) 70 - 99 mg/dL   BUN 10 6 - 23 mg/dL   Creatinine, Ser 3.01 0.40 - 1.50 mg/dL   Total Bilirubin 0.8 0.2 - 1.2 mg/dL   Alkaline Phosphatase 58 39 - 117 U/L   AST 39 (H) 0 - 37 U/L   ALT 56 (H) 0 - 53 U/L   Total Protein 6.8 6.0 - 8.3 g/dL   Albumin 4.4 3.5 - 5.2 g/dL   GFR 601.09 >32.35 mL/min   Calcium 9.3 8.4 - 10.5 mg/dL  Hemoglobin T7D  Result Value Ref Range   Hgb A1c MFr Bld 8.2 (H) 4.6 - 6.5 %  Lipid panel  Result Value Ref Range   Cholesterol 172 0 - 200 mg/dL   Triglycerides 220.2 (H) 0.0 - 149.0 mg/dL   HDL 54.27 (L) >06.23 mg/dL   VLDL 76.2 (H) 0.0 - 83.1 mg/dL   Total CHOL/HDL Ratio 6    NonHDL 142.23   TSH  Result Value Ref Range   TSH 1.81 0.35 - 5.50 uIU/mL  LDL cholesterol, direct  Result Value Ref Range   Direct LDL 85.0 mg/dL

## 2021-09-25 ENCOUNTER — Encounter: Payer: Self-pay | Admitting: Family Medicine

## 2021-09-25 ENCOUNTER — Ambulatory Visit (INDEPENDENT_AMBULATORY_CARE_PROVIDER_SITE_OTHER): Payer: 59 | Admitting: Family Medicine

## 2021-09-25 VITALS — BP 110/64 | HR 78 | Temp 97.6°F | Resp 18 | Ht 75.5 in | Wt 304.0 lb

## 2021-09-25 DIAGNOSIS — Z1329 Encounter for screening for other suspected endocrine disorder: Secondary | ICD-10-CM | POA: Diagnosis not present

## 2021-09-25 DIAGNOSIS — R7303 Prediabetes: Secondary | ICD-10-CM

## 2021-09-25 DIAGNOSIS — E1165 Type 2 diabetes mellitus with hyperglycemia: Secondary | ICD-10-CM

## 2021-09-25 DIAGNOSIS — Z0000 Encounter for general adult medical examination without abnormal findings: Secondary | ICD-10-CM | POA: Diagnosis not present

## 2021-09-25 DIAGNOSIS — Z13220 Encounter for screening for lipoid disorders: Secondary | ICD-10-CM

## 2021-09-25 DIAGNOSIS — G4733 Obstructive sleep apnea (adult) (pediatric): Secondary | ICD-10-CM

## 2021-09-25 DIAGNOSIS — Z130 Encounter for screening for diseases of the blood and blood-forming organs and certain disorders involving the immune mechanism: Secondary | ICD-10-CM

## 2021-09-25 DIAGNOSIS — E785 Hyperlipidemia, unspecified: Secondary | ICD-10-CM

## 2021-09-25 LAB — COMPREHENSIVE METABOLIC PANEL
ALT: 56 U/L — ABNORMAL HIGH (ref 0–53)
AST: 39 U/L — ABNORMAL HIGH (ref 0–37)
Albumin: 4.4 g/dL (ref 3.5–5.2)
Alkaline Phosphatase: 58 U/L (ref 39–117)
BUN: 10 mg/dL (ref 6–23)
CO2: 27 mEq/L (ref 19–32)
Calcium: 9.3 mg/dL (ref 8.4–10.5)
Chloride: 102 mEq/L (ref 96–112)
Creatinine, Ser: 0.84 mg/dL (ref 0.40–1.50)
GFR: 112.3 mL/min (ref >60.00)
Glucose, Bld: 169 mg/dL — ABNORMAL HIGH (ref 70–99)
Potassium: 4.5 mEq/L (ref 3.5–5.1)
Sodium: 138 mEq/L (ref 135–145)
Total Bilirubin: 0.8 mg/dL (ref 0.2–1.2)
Total Protein: 6.8 g/dL (ref 6.0–8.3)

## 2021-09-25 LAB — CBC
HCT: 42.8 % (ref 39.0–52.0)
Hemoglobin: 14.4 g/dL (ref 13.0–17.0)
MCHC: 33.7 g/dL (ref 30.0–36.0)
MCV: 86.1 fl (ref 78.0–100.0)
Platelets: 150 10*3/uL (ref 150.0–400.0)
RBC: 4.96 Mil/uL (ref 4.22–5.81)
RDW: 13.6 % (ref 11.5–15.5)
WBC: 7.8 10*3/uL (ref 4.0–10.5)

## 2021-09-25 LAB — LDL CHOLESTEROL, DIRECT: Direct LDL: 85 mg/dL

## 2021-09-25 LAB — TSH: TSH: 1.81 u[IU]/mL (ref 0.35–5.50)

## 2021-09-25 LAB — HEMOGLOBIN A1C: Hgb A1c MFr Bld: 8.2 % — ABNORMAL HIGH (ref 4.6–6.5)

## 2021-09-25 LAB — LIPID PANEL
Cholesterol: 172 mg/dL (ref 0–200)
HDL: 29.6 mg/dL — ABNORMAL LOW (ref >39.00)
NonHDL: 142.23
Total CHOL/HDL Ratio: 6
Triglycerides: 282 mg/dL — ABNORMAL HIGH (ref 0.0–149.0)
VLDL: 56.4 mg/dL — ABNORMAL HIGH (ref 0.0–40.0)

## 2021-09-25 MED ORDER — METFORMIN HCL ER 500 MG PO TB24: 500.0000 mg | ORAL_TABLET | Freq: Every day | ORAL | 3 refills | Status: DC

## 2021-09-26 MED ORDER — METFORMIN HCL ER 500 MG PO TB24: 1000.0000 mg | ORAL_TABLET | Freq: Every day | ORAL | 3 refills | Status: DC

## 2021-09-26 MED ORDER — OZEMPIC (0.25 OR 0.5 MG/DOSE) 2 MG/3ML ~~LOC~~ SOPN: PEN_INJECTOR | SUBCUTANEOUS | 1 refills | Status: DC

## 2021-10-31 ENCOUNTER — Encounter: Payer: Self-pay | Admitting: Family Medicine

## 2021-11-01 MED ORDER — BD PEN NEEDLE SHORT U/F 31G X 8 MM MISC: 1 refills | Status: DC

## 2021-12-12 ENCOUNTER — Other Ambulatory Visit: Payer: Self-pay | Admitting: Family Medicine

## 2021-12-12 DIAGNOSIS — R7303 Prediabetes: Secondary | ICD-10-CM

## 2021-12-13 ENCOUNTER — Other Ambulatory Visit: Payer: Self-pay | Admitting: Family Medicine

## 2021-12-13 DIAGNOSIS — R7303 Prediabetes: Secondary | ICD-10-CM

## 2021-12-13 MED ORDER — METFORMIN HCL ER 500 MG PO TB24: 1000.0000 mg | ORAL_TABLET | Freq: Every day | ORAL | 3 refills | Status: DC

## 2021-12-13 MED ORDER — OZEMPIC (0.25 OR 0.5 MG/DOSE) 2 MG/3ML ~~LOC~~ SOPN: 0.5000 mg | PEN_INJECTOR | SUBCUTANEOUS | 3 refills | Status: DC

## 2021-12-15 ENCOUNTER — Encounter: Payer: Self-pay | Admitting: Family Medicine

## 2021-12-15 MED ORDER — OZEMPIC (0.25 OR 0.5 MG/DOSE) 2 MG/3ML ~~LOC~~ SOPN: 0.5000 mg | PEN_INJECTOR | SUBCUTANEOUS | 3 refills | Status: DC

## 2022-01-18 NOTE — Progress Notes (Addendum)
[facility] [address] [place]. [place] [address] [phone]       OFFICE FOLLOW UP NOTE  [name] Date of Birth:  [birth year] Medical Record Number:  [id number]   Reason for visit: CPAP follow-up    SUBJECTIVE:   CHIEF COMPLAINT:  Chief Complaint  Patient presents with   Room 2    Pt is here Alone. Pt states that everything has been going good. Pt states no fatigue.      HPI:   Update 01/22/2022 JM: Patient returns for yearly CPAP compliance visit. Received replacement CPAP machine back in May. Was unable to obtain download today off new machine, was able to review limited compliance data from [facility] app which showed 100% usage over the past 30 days and satisfactory nightly AHI.   Continues to tolerate CPAP well.  Reports sleeping well throughout the night and daytime energy level satisfactory.  No questions or concerns regarding CPAP today.  Epworth Sleepiness Scale 6/24 (prior 10/24)   ADDENDUM 02/07/2022: Received compliance report over the past 87 days with 83 days usage in all 83 days greater than 4 hours for 95.4% compliance.  Average usage 6 hours and 57 minutes.  Residual AHI 0.1.  Pressure in the 95th percentile 8.3 on pressure settings of 6-18.  Average leak 1.2 LPM.      History from Dr. Oliva Bustard prior OV note on 01/03/2021 provided for reference purposes only Jared Blake is a 36 y.o. year old Caucasian male patient seen here in a RV on 01-03-2021. Mr. Craney reports that he was not surprised that his sleep test showed an AHI of almost 30 of 68.1/h which would equal 1 event a minute of sleep or more.  Interestingly the AHI was a little bit lower and rapid eye movement sleep and in the nonrapid eye movement sleep phases.  REM sleep AHI was 56.6 and normal non-REM 70.3.  Oxygen saturation at nadir was 83% which is not critical, heart rate was in normal range.  Auto CPAP was initiated between 6 and 18 cmH2O pressure was 2 cm  expiratory pressure relief and a mask of patient's comfort.  He did humidification was to be provided.  He did not have snoring data or positional data.  The patient was given a CPAP machine by [employer].  The data I am looking at today and compares 11/23/2020 with the end date of 12/31/2020.  The patient has used the machine 100% of these for 40 days.  And on average 6 hours and 43 minutes at night.  His residual AHI is 0.4 which is the best possible outcome.  He is using a Careers adviser.  Allowing him to sleep on the sides.  Further interesting is that there are no central apneas arising and that the average air leak is extremely low.  So based on these data the patient also confirms that he has no problems to fall asleep at night he stays asleep, is less restless.  His spouse has been happy that he is snoring less.   He may have 1 nocturia at night.  He feels that he can stay awake longer he has only once inadvertently falling asleep in the evening hours before he wanted to go to bed.  So this is a decrease in daytime sleepiness that is quite significant. Hba1c has dropped to normal, he lost 15 pounds on metformin.      ROS:   14 system review of systems performed and negative  with exception of those listed in HPI  PMH:  Past Medical History:  Diagnosis Date   Allergy    Esophagitis     PSH:  Past Surgical History:  Procedure Laterality Date   bilateral turbinnde reduction     SEPTOPLASTY      Social History:  Social History   Socioeconomic History   Marital status: Married    Spouse name: Not on file   Number of children: Not on file   Years of education: Not on file   Highest education level: Not on file  Occupational History   Not on file  Tobacco Use   Smoking status: Never   Smokeless tobacco: Never  Vaping Use   Vaping Use: Never used  Substance and Sexual Activity   Alcohol use: Yes    Comment: social   Drug use: Never   Sexual activity: Not on file  Other  Topics Concern   Not on file  Social History Narrative   Not on file   Social Determinants of Health   Financial Resource Strain: Not on file  Food Insecurity: Not on file  Transportation Needs: Not on file  Physical Activity: Not on file  Stress: Not on file  Social Connections: Not on file  Intimate Partner Violence: Not on file    Family History:  Family History  Problem Relation Age of Onset   Diabetes Mother    Diabetes Father     Medications:   Current Outpatient Medications on File Prior to Visit  Medication Sig Dispense Refill   cetirizine (ZYRTEC) 10 MG tablet Take 10 mg by mouth daily.     metFORMIN (GLUCOPHAGE-XR) 500 MG 24 hr tablet Take 2 tablets (1,000 mg total) by mouth daily with breakfast. 180 tablet 3   Semaglutide,0.25 or 0.5MG /DOS, (OZEMPIC, 0.25 OR 0.5 MG/DOSE,) 2 MG/3ML SOPN Inject 0.5 mg into the skin once a week. 3 mL 3   No current facility-administered medications on file prior to visit.    Allergies:  No Known Allergies    OBJECTIVE:  Physical Exam  Vitals:   01/22/22 1054  BP: 121/71  Pulse: 78  Weight: 291 lb (132 kg)  Height: 6\' 3"  (1.905 m)   Body mass index is 36.37 kg/m. No results found.   General: well developed, well nourished, seated, in no evident distress Head: head normocephalic and atraumatic.   Neck: supple with no carotid or supraclavicular bruits Cardiovascular: regular rate and rhythm, no murmurs Musculoskeletal: no deformity Skin:  no rash/petichiae Vascular:  Normal pulses all extremities   Neurologic Exam Mental Status: Awake and fully alert. Oriented to place and time. Recent and remote memory intact. Attention span, concentration and fund of knowledge appropriate. Mood and affect appropriate.  Cranial Nerves: Pupils equal, briskly reactive to light. Extraocular movements full without nystagmus. Visual fields full to confrontation. Hearing intact. Facial sensation intact. Face, tongue, palate moves  normally and symmetrically.  Motor: Normal bulk and tone. Normal strength in all tested extremity muscles Sensory.: intact to touch , pinprick , position and vibratory sensation.  Coordination: Rapid alternating movements normal in all extremities. Finger-to-nose and heel-to-shin performed accurately bilaterally. Gait and Station: Arises from chair without difficulty. Stance is normal. Gait demonstrates normal stride length and balance without use of AD.  Reflexes: 1+ and symmetric. Toes downgoing.         ASSESSMENT/PLAN: KANON NOVOSEL is a 36 y.o. year old male    OSA on CPAP : Unable to obtain full compliance  report today as he is currently using a replacement machine since 06/2021 and [employer] did not update serial numbers. Working with [employer] to correct this in order to obtain compliance report.  Will make an ADDENDUM to chart once about to obtain compliance report.  ADDENDUM 02/07/2022: Download report shows excellent usage and optimal residual AHI.  Continue current pressure settings of 6-18.     Follow up in 1 year or call earlier if needed   CC:  PCP: Copland, Gwenlyn Found, MD    I spent 21 minutes of face-to-face and non-face-to-face time with patient.  This included previsit chart review, lab review, study review, order entry, electronic health record documentation, patient education regarding sleep apnea and answered all other questions to patient's satisfaction   Ihor Austin, [facility]  [facility] [address] [place], [place] [address]  Phone [phone] Fax [phone] Note: This document was prepared with digital dictation and possible smart phrase technology. Any transcriptional errors that result from this process are unintentional.

## 2022-01-22 ENCOUNTER — Encounter: Payer: Self-pay | Admitting: Adult Health

## 2022-01-22 ENCOUNTER — Ambulatory Visit: Payer: 59 | Admitting: Adult Health

## 2022-01-22 VITALS — BP 121/71 | HR 78 | Ht 75.0 in | Wt 291.0 lb

## 2022-01-22 DIAGNOSIS — G4733 Obstructive sleep apnea (adult) (pediatric): Secondary | ICD-10-CM

## 2022-02-07 ENCOUNTER — Telehealth: Payer: Self-pay | Admitting: Neurology

## 2022-02-07 NOTE — Telephone Encounter (Signed)
Received compliance report from the patient Jared Blake. Pt has used the Blake 83 out of 87 days. Will provide the information to Frann Rider, NP to review and update previous visit note.

## 2022-02-07 NOTE — Telephone Encounter (Signed)
Reviewed and made addendum to OV note on 01/22/2022. Thank you.

## 2022-03-28 ENCOUNTER — Other Ambulatory Visit: Payer: Self-pay

## 2022-03-28 MED ORDER — OZEMPIC (0.25 OR 0.5 MG/DOSE) 2 MG/3ML ~~LOC~~ SOPN: 0.5000 mg | PEN_INJECTOR | SUBCUTANEOUS | 0 refills | Status: DC

## 2022-04-19 NOTE — Patient Instructions (Addendum)
Good to see you again today, I will be in touch with your labs Have a great time in [place]!  Avoid mosquitos For yellow fever vaccine, contact the HD travel clinic  Clinic Days and Times For your convenience, we offer services in both our [place] and [facility] locations. In [place] we are located at [employer] Our [place] clinic is located at [address] Call [phone], Monday-Friday for individual appointments.

## 2022-04-19 NOTE — Progress Notes (Signed)
[facility] [address] [place], [place] [address] [phone]  Date:  04/23/2022   Name:  Jared Blake   DOB:  [birth year]   MRN:  [id number]  PCP:  Darreld Mclean, MD    Chief Complaint: DM f/u and Travel to [place] (Concerns/ questions: none besides checking on vaccines for travel. /foot exam due/Eye exam: none recently/Hiv screen due/a1c, urine MA due)   History of Present Illness:  Jared Blake is a 37 y.o. very pleasant male patient who presents with the following:  Patient seen today for follow-up.  Most recent visit with myself was in August History of diabetes, sleep apnea, obesity  Married to [place] who is immunocompromised  At our last visit in August his A1c had gone up fairly dramatically to 8.2%. He had previously been on just a small dose of metformin.  We increased metformin and added Ozempic-he is currently taking 0.5 mg He is feeling well- he is sometimes nauseated with ozempic No vomiting in particular  Wt Readings from Last 3 Encounters:  04/23/22 283 lb 6.4 oz (128.5 kg)  01/22/22 291 lb (132 kg)  09/25/21 (!) 304 lb (137.9 kg)   He has lost about 20 lbs with ozempic and is overall pleased with his progress  Most recent visit with neurology to follow-up sleep apnea was in December He is quite compliant with his CPAP  Foot exam due-updated today Eye exam- recommended annual check  Urine microalbumin-update today Can do hepatitis C screening Lab update-most recent labs in August, CMP, lipid, CBC, A1c, thyroid  Loman also notes he will be traveling to [place] for 3 weeks, towards the beginning of April.  He will also be going back for another 3-week trip later this fall.  He is traveling for work, will be in the city of [place] exclusively  Patient Active Problem List   Diagnosis Date Noted   Dyslipidemia 09/25/2021   Uncontrolled type 2 diabetes mellitus with  hyperglycemia ([place]) 09/21/2021   OSA on CPAP 01/03/2021   Severe obstructive sleep apnea-hypopnea syndrome 09/26/2020   Morbid obesity ([place]) 08/23/2020   Loud snoring 08/23/2020   Status post nasal surgery 08/23/2020    Past Medical History:  Diagnosis Date   Allergy    Esophagitis     Past Surgical History:  Procedure Laterality Date   bilateral turbinnde reduction     SEPTOPLASTY      Social History   Tobacco Use   Smoking status: Never   Smokeless tobacco: Never  Vaping Use   Vaping Use: Never used  Substance Use Topics   Alcohol use: Yes    Comment: social   Drug use: Never    Family History  Problem Relation Age of Onset   Diabetes Mother    Diabetes Father     No Known Allergies  Medication list has been reviewed and updated.  Current Outpatient Medications on File Prior to Visit  Medication Sig Dispense Refill   cetirizine (ZYRTEC) 10 MG tablet Take 10 mg by mouth daily.     metFORMIN (GLUCOPHAGE-XR) 500 MG 24 hr tablet Take 2 tablets (1,000 mg total) by mouth daily with breakfast. 180 tablet 3   Semaglutide,0.25 or 0.5MG /DOS, (OZEMPIC, 0.25 OR 0.5 MG/DOSE,) 2 MG/3ML SOPN Inject 0.5 mg into the skin once a week. 3 mL 0   No current facility-administered medications on file prior to visit.    Review of  Systems:  As per HPI- otherwise negative.   Physical Examination: Vitals:   04/23/22 1559  BP: 118/72  Pulse: 79  Resp: 18  Temp: 97.7 F (36.5 C)  SpO2: 97%   Vitals:   04/23/22 1559  Weight: 283 lb 6.4 oz (128.5 kg)  Height: 6' 3.5" (1.918 m)   Body mass index is 34.95 kg/m. Ideal Body Weight: Weight in (lb) to have BMI = 25: 202.3  GEN: no acute distress.  Obese, looks well HEENT: Atraumatic, Normocephalic.  Ears and Nose: No external deformity. CV: RRR, No M/G/R. No JVD. No thrill. No extra heart sounds. PULM: CTA B, no wheezes, crackles, rhonchi. No retractions. No resp. distress. No accessory muscle use.Marland Kitchen EXTR: No  c/c/e PSYCH: Normally interactive. Conversant.  Foot exam normal  Assessment and Plan: Uncontrolled type 2 diabetes mellitus with hyperglycemia (HCC) - Plan: Comprehensive metabolic panel, Hemoglobin A1c, Microalbumin / creatinine urine ratio  Dyslipidemia - Plan: Lipid panel  Class 2 severe obesity due to excess calories with serious comorbidity and body mass index (BMI) of 36.0 to 36.9 in adult [facility])  Screening for hyperlipidemia  Screening for deficiency anemia - Plan: CBC  Travel advice encounter Patient seen today for follow-up.  He has made good progress with weight loss on Ozempic, check A1c today Other lab work is pending as above Discussed upcoming travel.  He declines hepatitis A and typhoid vaccination.  He does plan to obtain yellow fever vaccination from health department.  This is not required but is recommended for his travel plans.  He does not need malaria prophylaxis per CDC website for his particular itinerary.  Caution regarding rabies, avoid any stray or unknown animals  Signed Lamar Blinks, MD  Addnd 3/19-received labs as below, message to pa Results for orders placed or performed in visit on 04/23/22  CBC  Result Value Ref Range   WBC 8.2 4.0 - 10.5 K/uL   RBC 5.22 4.22 - 5.81 Mil/uL   Platelets 192.0 150.0 - 400.0 K/uL   Hemoglobin 15.3 13.0 - 17.0 g/dL   HCT 44.3 39.0 - 52.0 %   MCV 85.0 78.0 - 100.0 fl   MCHC 34.4 30.0 - 36.0 g/dL   RDW 13.5 11.5 - 15.5 %  Comprehensive metabolic panel  Result Value Ref Range   Sodium 138 135 - 145 mEq/L   Potassium 4.5 3.5 - 5.1 mEq/L   Chloride 102 96 - 112 mEq/L   CO2 24 19 - 32 mEq/L   Glucose, Bld 98 70 - 99 mg/dL   BUN 12 6 - 23 mg/dL   Creatinine, Ser 0.91 0.40 - 1.50 mg/dL   Total Bilirubin 0.9 0.2 - 1.2 mg/dL   Alkaline Phosphatase 50 39 - 117 U/L   AST 34 0 - 37 U/L   ALT 39 0 - 53 U/L   Total Protein 7.5 6.0 - 8.3 g/dL   Albumin 4.7 3.5 - 5.2 g/dL   GFR 108.10 >60.00 mL/min   Calcium 9.8 8.4 -  10.5 mg/dL  Hemoglobin A1c  Result Value Ref Range   Hgb A1c MFr Bld 6.0 4.6 - 6.5 %  Lipid panel  Result Value Ref Range   Cholesterol 134 0 - 200 mg/dL   Triglycerides 182.0 (H) 0.0 - 149.0 mg/dL   HDL 33.40 (L) >39.00 mg/dL   VLDL 36.4 0.0 - 40.0 mg/dL   LDL Cholesterol 64 0 - 99 mg/dL   Total CHOL/HDL Ratio 4    NonHDL 100.62   Microalbumin /  creatinine urine ratio  Result Value Ref Range   Microalb, Ur <0.7 0.0 - 1.9 mg/dL   Creatinine,U 87.0 mg/dL   Microalb Creat Ratio 0.8 0.0 - 30.0 mg/g     .this

## 2022-04-23 ENCOUNTER — Ambulatory Visit: Payer: 59 | Admitting: Family Medicine

## 2022-04-23 ENCOUNTER — Encounter: Payer: Self-pay | Admitting: Family Medicine

## 2022-04-23 VITALS — BP 118/72 | HR 79 | Temp 97.7°F | Resp 18 | Ht 75.5 in | Wt 283.4 lb

## 2022-04-23 DIAGNOSIS — E1165 Type 2 diabetes mellitus with hyperglycemia: Secondary | ICD-10-CM | POA: Diagnosis not present

## 2022-04-23 DIAGNOSIS — Z130 Encounter for screening for diseases of the blood and blood-forming organs and certain disorders involving the immune mechanism: Secondary | ICD-10-CM

## 2022-04-23 DIAGNOSIS — Z6836 Body mass index (BMI) 36.0-36.9, adult: Secondary | ICD-10-CM

## 2022-04-23 DIAGNOSIS — E785 Hyperlipidemia, unspecified: Secondary | ICD-10-CM

## 2022-04-23 DIAGNOSIS — Z13220 Encounter for screening for lipoid disorders: Secondary | ICD-10-CM

## 2022-04-23 DIAGNOSIS — Z7184 Encounter for health counseling related to travel: Secondary | ICD-10-CM

## 2022-04-24 ENCOUNTER — Encounter: Payer: Self-pay | Admitting: Family Medicine

## 2022-04-24 LAB — CBC
HCT: 44.3 % (ref 39.0–52.0)
Hemoglobin: 15.3 g/dL (ref 13.0–17.0)
MCHC: 34.4 g/dL (ref 30.0–36.0)
MCV: 85 fl (ref 78.0–100.0)
Platelets: 192 10*3/uL (ref 150.0–400.0)
RBC: 5.22 Mil/uL (ref 4.22–5.81)
RDW: 13.5 % (ref 11.5–15.5)
WBC: 8.2 10*3/uL (ref 4.0–10.5)

## 2022-04-24 LAB — COMPREHENSIVE METABOLIC PANEL
ALT: 39 U/L (ref 0–53)
AST: 34 U/L (ref 0–37)
Albumin: 4.7 g/dL (ref 3.5–5.2)
Alkaline Phosphatase: 50 U/L (ref 39–117)
BUN: 12 mg/dL (ref 6–23)
CO2: 24 mEq/L (ref 19–32)
Calcium: 9.8 mg/dL (ref 8.4–10.5)
Chloride: 102 mEq/L (ref 96–112)
Creatinine, Ser: 0.91 mg/dL (ref 0.40–1.50)
GFR: 108.1 mL/min (ref >60.00)
Glucose, Bld: 98 mg/dL (ref 70–99)
Potassium: 4.5 mEq/L (ref 3.5–5.1)
Sodium: 138 mEq/L (ref 135–145)
Total Bilirubin: 0.9 mg/dL (ref 0.2–1.2)
Total Protein: 7.5 g/dL (ref 6.0–8.3)

## 2022-04-24 LAB — LIPID PANEL
Cholesterol: 134 mg/dL (ref 0–200)
HDL: 33.4 mg/dL — ABNORMAL LOW (ref >39.00)
LDL Cholesterol: 64 mg/dL (ref 0–99)
NonHDL: 100.62
Total CHOL/HDL Ratio: 4
Triglycerides: 182 mg/dL — ABNORMAL HIGH (ref 0.0–149.0)
VLDL: 36.4 mg/dL (ref 0.0–40.0)

## 2022-04-24 LAB — MICROALBUMIN / CREATININE URINE RATIO
Creatinine,U: 87 mg/dL
Microalb Creat Ratio: 0.8 mg/g (ref 0.0–30.0)
Microalb, Ur: <0.7 mg/dL (ref 0.0–1.9)

## 2022-04-24 LAB — HEMOGLOBIN A1C: Hgb A1c MFr Bld: 6 % (ref 4.6–6.5)

## 2022-04-24 MED ORDER — OZEMPIC (0.25 OR 0.5 MG/DOSE) 2 MG/3ML ~~LOC~~ SOPN: 0.5000 mg | PEN_INJECTOR | SUBCUTANEOUS | 2 refills | Status: DC

## 2022-06-04 ENCOUNTER — Ambulatory Visit: Payer: 59 | Admitting: Family Medicine

## 2022-06-12 ENCOUNTER — Encounter: Payer: Self-pay | Admitting: Family Medicine

## 2022-06-12 DIAGNOSIS — R197 Diarrhea, unspecified: Secondary | ICD-10-CM

## 2022-06-12 NOTE — Telephone Encounter (Signed)
Last OV was 04/23/22 but no mention of this, would you like to see the pt?

## 2022-06-13 NOTE — Addendum Note (Signed)
Addended by: Abbe Amsterdam C on: 06/13/2022 12:51 PM   Modules accepted: Orders

## 2022-06-16 NOTE — Progress Notes (Unsigned)
[facility] [address] [place], [place] [address] [phone]  Date:  06/18/2022   Name:  Jared Blake   DOB:  Jun 26, 1985   MRN:  [id number]  PCP:  Pearline Cables, MD    Chief Complaint: No chief complaint on file.   History of Present Illness:  IRISH GONYO is a 37 y.o. very pleasant male patient who presents with the following:   History of diabetes, sleep apnea, obesity Pt seen today with concern of diarrhea Last visit with myself was in March-  at that time he had started ozempic and we saw good improvement of his A1c Lab Results  Component Value Date   HGBA1C 6.0 04/23/2022   Married to [place] who is immunocompromised  He contacted me last week with concern of diarrhea after recent international travel- we had him pick up a stool test kit  Sorry if this is a bit long: I got a pretty rough bout of gastroenteritis with severe diarrhea and vomiting on my second day in [place] about a month ago, and went to the emergency room where I received fluids, Ondansetron, and Enterogermina. The vomiting went away and the diarrhea lessened, but then I started vacillating between diarrhea and constipation for the next two weeks, and my stool and gas started smelling much more foul and very different. The first week I was back here (last week), the diarrhea and difference in my gas and stool continued. The diarrhea is still continuing, but the difference in gas and stool has cleared. The few more solid stools I've had have been somewhat flat in shape, which I understand is maybe a sign of a growth in my colon. I think it would be a good idea to have a colonoscopy just to check that. I've also had a few instances in the past week of nausea after eating. This has happened before a few years ago, and was noted to be due to a low effusion rate from my gallbladder. I assume that's what that is again, but it wouldn't hurt to  follow up on that too.  Patient Active Problem List   Diagnosis Date Noted   Dyslipidemia 09/25/2021   Uncontrolled type 2 diabetes mellitus with hyperglycemia (HCC) 09/21/2021   OSA on CPAP 01/03/2021   Severe obstructive sleep apnea-hypopnea syndrome 09/26/2020   Morbid obesity (HCC) 08/23/2020   Loud snoring 08/23/2020   Status post nasal surgery 08/23/2020    Past Medical History:  Diagnosis Date   Allergy    Esophagitis     Past Surgical History:  Procedure Laterality Date   bilateral turbinnde reduction     SEPTOPLASTY      Social History   Tobacco Use   Smoking status: Never   Smokeless tobacco: Never  Vaping Use   Vaping Use: Never used  Substance Use Topics   Alcohol use: Yes    Comment: social   Drug use: Never    Family History  Problem Relation Age of Onset   Diabetes Mother    Diabetes Father     No Known Allergies  Medication list has been reviewed and updated.  Current Outpatient Medications on File Prior to Visit  Medication Sig Dispense Refill   cetirizine (ZYRTEC) 10 MG tablet Take 10 mg by mouth daily.     metFORMIN (GLUCOPHAGE-XR) 500 MG 24 hr tablet Take 2 tablets (1,000 mg total) by mouth daily with breakfast. 180 tablet  3   Semaglutide,0.25 or 0.5MG /DOS, (OZEMPIC, 0.25 OR 0.5 MG/DOSE,) 2 MG/3ML SOPN Inject 0.5 mg into the skin once a week. 9 mL 2   No current facility-administered medications on file prior to visit.    Review of Systems:  As per HPI- otherwise negative.   Physical Examination: There were no vitals filed for this visit. There were no vitals filed for this visit. There is no height or weight on file to calculate BMI. Ideal Body Weight:    GEN: no acute distress. HEENT: Atraumatic, Normocephalic.  Ears and Nose: No external deformity. CV: RRR, No M/G/R. No JVD. No thrill. No extra heart sounds. PULM: CTA B, no wheezes, crackles, rhonchi. No retractions. No resp. distress. No accessory muscle use. ABD: S, NT,  ND, +BS. No rebound. No HSM. EXTR: No c/c/e PSYCH: Normally interactive. Conversant.    Assessment and Plan: ***  Signed Abbe Amsterdam, MD

## 2022-06-18 ENCOUNTER — Ambulatory Visit: Payer: 59 | Admitting: Family Medicine

## 2022-06-18 VITALS — BP 122/78 | HR 81 | Temp 98.1°F | Resp 18 | Ht 75.5 in | Wt 286.0 lb

## 2022-06-18 DIAGNOSIS — R197 Diarrhea, unspecified: Secondary | ICD-10-CM | POA: Diagnosis not present

## 2022-06-18 DIAGNOSIS — R112 Nausea with vomiting, unspecified: Secondary | ICD-10-CM | POA: Diagnosis not present

## 2022-06-18 NOTE — Patient Instructions (Signed)
It was good to see you today- I am sorry you have been so sick!  I will be in touch with your labs and your stool tests asap Take the C diff kit home in case we need it as well I will also work on a referral to [facility] for you in case we need it

## 2022-06-19 ENCOUNTER — Encounter: Payer: Self-pay | Admitting: Family Medicine

## 2022-06-19 LAB — COMPREHENSIVE METABOLIC PANEL
ALT: 47 U/L (ref 0–53)
AST: 47 U/L — ABNORMAL HIGH (ref 0–37)
Albumin: 4.3 g/dL (ref 3.5–5.2)
Alkaline Phosphatase: 52 U/L (ref 39–117)
BUN: 12 mg/dL (ref 6–23)
CO2: 23 mEq/L (ref 19–32)
Calcium: 9.5 mg/dL (ref 8.4–10.5)
Chloride: 103 mEq/L (ref 96–112)
Creatinine, Ser: 0.96 mg/dL (ref 0.40–1.50)
GFR: 101.27 mL/min (ref >60.00)
Glucose, Bld: 208 mg/dL — ABNORMAL HIGH (ref 70–99)
Potassium: 4.1 mEq/L (ref 3.5–5.1)
Sodium: 138 mEq/L (ref 135–145)
Total Bilirubin: 0.7 mg/dL (ref 0.2–1.2)
Total Protein: 6.7 g/dL (ref 6.0–8.3)

## 2022-06-19 LAB — CBC
HCT: 45 % (ref 39.0–52.0)
Hemoglobin: 15.4 g/dL (ref 13.0–17.0)
MCHC: 34.1 g/dL (ref 30.0–36.0)
MCV: 85.1 fl (ref 78.0–100.0)
Platelets: 206 10*3/uL (ref 150.0–400.0)
RBC: 5.28 Mil/uL (ref 4.22–5.81)
RDW: 13.8 % (ref 11.5–15.5)
WBC: 11.6 10*3/uL — ABNORMAL HIGH (ref 4.0–10.5)

## 2022-06-19 LAB — HEPATITIS PANEL, ACUTE
Hep A IgM: NONREACTIVE
Hep B C IgM: NONREACTIVE
Hepatitis B Surface Ag: NONREACTIVE
Hepatitis C Ab: NONREACTIVE

## 2022-06-20 LAB — STOOL CULTURE: E coli, Shiga toxin Assay: NEGATIVE

## 2022-06-21 ENCOUNTER — Encounter: Payer: Self-pay | Admitting: Family Medicine

## 2022-06-21 LAB — OVA AND PARASITE EXAMINATION
CONCENTRATE RESULT:: NONE SEEN
MICRO NUMBER:: 14947836
SPECIMEN QUALITY:: ADEQUATE
TRICHROME RESULT:: NONE SEEN

## 2022-06-22 ENCOUNTER — Encounter: Payer: Self-pay | Admitting: Family Medicine

## 2022-06-22 LAB — STOOL CULTURE

## 2022-07-20 ENCOUNTER — Other Ambulatory Visit: Payer: 59

## 2022-07-20 DIAGNOSIS — R197 Diarrhea, unspecified: Secondary | ICD-10-CM

## 2022-07-24 ENCOUNTER — Encounter: Payer: Self-pay | Admitting: Family Medicine

## 2022-07-24 LAB — CLOSTRIDIUM DIFFICILE BY PCR: Toxigenic C. Difficile by PCR: NEGATIVE

## 2022-07-27 ENCOUNTER — Telehealth: Payer: Self-pay | Admitting: Gastroenterology

## 2022-07-27 NOTE — Telephone Encounter (Signed)
Good Afternoon Dr Tomasa Rand,  Supervising MD 6/21 PM  We have received a referral for this patient for diarrhea from presumed infectious origin from Dr Dallas Schimke. Patient also expressed that his stool is abnormally shaped. He has previous history with [facility] wishing to switch to our practice since he also goes to [facility]. Records are available to view under media tab, please review them at your earliest convenience and advise on scheduling.  Thank You!

## 2022-08-02 ENCOUNTER — Encounter: Payer: Self-pay | Admitting: Gastroenterology

## 2022-08-02 NOTE — Telephone Encounter (Signed)
Called patient and left detailed message to call back and schedule. 

## 2022-09-10 ENCOUNTER — Ambulatory Visit: Payer: 59 | Admitting: Gastroenterology

## 2022-09-10 ENCOUNTER — Encounter: Payer: Self-pay | Admitting: Gastroenterology

## 2022-09-10 ENCOUNTER — Other Ambulatory Visit: Payer: 59

## 2022-09-10 VITALS — BP 118/70 | HR 82 | Ht 75.5 in | Wt 294.0 lb

## 2022-09-10 DIAGNOSIS — R112 Nausea with vomiting, unspecified: Secondary | ICD-10-CM | POA: Diagnosis not present

## 2022-09-10 DIAGNOSIS — K200 Eosinophilic esophagitis: Secondary | ICD-10-CM | POA: Diagnosis not present

## 2022-09-10 DIAGNOSIS — R197 Diarrhea, unspecified: Secondary | ICD-10-CM

## 2022-09-10 DIAGNOSIS — K760 Fatty (change of) liver, not elsewhere classified: Secondary | ICD-10-CM

## 2022-09-10 DIAGNOSIS — K591 Functional diarrhea: Secondary | ICD-10-CM | POA: Diagnosis not present

## 2022-09-10 MED ORDER — NA SULFATE-K SULFATE-MG SULF 17.5-3.13-1.6 GM/177ML PO SOLN: 1.0000 | Freq: Once | ORAL | 0 refills | Status: AC

## 2022-09-10 NOTE — Patient Instructions (Addendum)
_______________________________________________________  If your blood pressure at your visit was 140/90 or greater, please contact your primary care physician to follow up on this.  _______________________________________________________  If you are age 37 or older, your body mass index should be between 23-30. Your Body mass index is 36.26 kg/m. If this is out of the aforementioned range listed, please consider follow up with your Primary Care Provider.  If you are age 5 or younger, your body mass index should be between 19-25. Your Body mass index is 36.26 kg/m. If this is out of the aformentioned range listed, please consider follow up with your Primary Care Provider.   Your provider has requested that you go to the basement level for lab work before leaving today. Press "B" on the elevator. The lab is located at the first door on the left as you exit the elevator.  Please purchase Metamucil over the counter. Take as directed.   Take loperamide as needed.  You have been scheduled for an endoscopy and colonoscopy. Please follow the written instructions given to you at your visit today.  Please pick up your prep supplies at the pharmacy within the next 1-3 days.  If you use inhalers (even only as needed), please bring them with you on the day of your procedure.  DO NOT TAKE 7 DAYS PRIOR TO TEST- Trulicity (dulaglutide) Ozempic, Wegovy (semaglutide) Mounjaro (tirzepatide) Bydureon Bcise (exanatide extended release)  DO NOT TAKE 1 DAY PRIOR TO YOUR TEST Rybelsus (semaglutide) Adlyxin (lixisenatide) Victoza (liraglutide) Byetta (exanatide)  The [facility] providers would like to encourage you to use [facility] to communicate with providers for non-urgent requests or questions.  Due to long hold times on the telephone, sending your provider a message by [facility] may be a faster and more efficient way to get a response.  Please allow 48 business hours for a response.  Please remember  that this is for non-urgent requests.   Marland Kitchenthx

## 2022-09-10 NOTE — Progress Notes (Signed)
HPI : GUMESINDO BLANCETT is a very pleasant 37 y.o. male male with a history of diabetes, asthma and obesity who is referred to [place] by Copland, Gwenlyn Found, MD for further evaluation of chronic diarrhea.   He states that he has had problems with diarrhea off and on for years now.  The diarrhea did worsen when he started metformin about a year ago.  He has 3-4 bowel movements per day on average, and on a bad day will have 6-7 bowel movements.  He frequently has urgency and has had incontinence on a few occasions.  He denies any nocturnal bowel movements.  No blood in the stool.  No problems with constipation or hard stools.  His weight has been stable.  He has not noted any foods that cause particular more problems than others. Sometimes he takes Pepto-Bismol or Imodium, which do help slow his diarrhea down. He was tested for for C. difficile and parasites back in May/June when his diarrhea had worsened following a trip to [place].  Tests for infectious causes were negative. He also notes a change in his stool caliber.  When he does have formed stools, they are always flat, whereas these to be rounded to removal.  This has been going on for several months.  He was also diagnosed with eosinophilic esophagitis back in 2020.  Initial presenting symptoms were dysphagia.  An EGD by Dr. Levora Angel showed a widely patent Schatzki ring which was dilated with Savary dilator (15 mm) without mucosal rent.  There were mucosal findings suspicious for eosinophilic esophagitis, and biopsies confirmed elevated eosinophils (greater than 54 eos/hpf).  The patient was prescribed Protonix following his EGD.  He states that he took Protonix for several months, but then stopped.  He did not notice a difference when he was taking it, and did not notice a difference once he stopped taking it.  He was not treated with any other medications for eosinophilic esophagitis and did not try any empiric dietary eliminations. Currently, he has  occasional dysphagia, but it is not frequent or significant problem for him.  He rarely has heartburn or acid regurgitation, but does report having foul-smelling belches ("like rotten eggs").  He also has a history of recurrent episodes of nausea and generalized abdominal pain.  Sometimes these episodes of nausea are associated with vomiting.  He has noted that eating eggs tends to trigger these symptoms, but not reliably.  This happens maybe once a month.  He underwent a right upper quadrant ultrasound in 2021 to evaluate this, which was notable for fatty liver, but otherwise normal.  A subsequent HIDA scan showed a low ejection fraction, suggestive of biliary dyskinesia.  He has had mildly elevated aminotransferases since at least 2022 (ALT 50s, AST 30s-40s).  He rarely drinks alcohol.  No family history of liver disease.  Right upper quadrant ultrasound April 10, 2019 IMPRESSION: Heterogeneous liver with increased echogenicity likely reflecting fatty liver. Small right hepatic lobe lesion, which cannot be definitively characterized as a cyst. Suggest follow-up ultrasound  HIDA scan May 18, 2019 IMPRESSION: Gallbladder ejection fraction of 28% was measured following Ensure administration which is below normal and concerning for biliary dyskinesia.  CT abdomen/pelvis with contrast April 04, 2020 IMPRESSION: No acute abnormality or finding to explain the patient's symptoms. Negative for appendicitis. Fatty infiltration of the liver. Small fat containing umbilical hernia  Past Medical History:  Diagnosis Date   Allergy    Asthma    Diabetes (HCC)  Esophagitis    Obesity    Sleep apnea      Past Surgical History:  Procedure Laterality Date   bilateral turbinnde reduction     SEPTOPLASTY     Family History  Problem Relation Age of Onset   Diabetes Mother    Diabetes Father    Irritable bowel syndrome Brother    Colon cancer Neg Hx    Stomach cancer Neg Hx     Esophageal cancer Neg Hx    Social History   Tobacco Use   Smoking status: Never   Smokeless tobacco: Never  Vaping Use   Vaping status: Never Used  Substance Use Topics   Alcohol use: Yes    Comment: social   Drug use: Never   Current Outpatient Medications  Medication Sig Dispense Refill   cetirizine (ZYRTEC) 10 MG tablet Take 10 mg by mouth daily.     metFORMIN (GLUCOPHAGE-XR) 500 MG 24 hr tablet Take 2 tablets (1,000 mg total) by mouth daily with breakfast. 180 tablet 3   Semaglutide,0.25 or 0.5MG /DOS, (OZEMPIC, 0.25 OR 0.5 MG/DOSE,) 2 MG/3ML SOPN Inject 0.5 mg into the skin once a week. 9 mL 2   No current facility-administered medications for this visit.   No Known Allergies   Review of Systems: All systems reviewed and negative except where noted in HPI.    No results found.  Physical Exam: BP 118/70   Pulse 82   Ht 6' 3.5" (1.918 m)   Wt 294 lb (133.4 kg)   BMI 36.26 kg/m  Constitutional: Pleasant,well-developed, Caucasian male in no acute distress. HEENT: Normocephalic and atraumatic. Conjunctivae are normal. No scleral icterus. Neck supple.  Cardiovascular: Normal rate, regular rhythm.  Pulmonary/chest: Effort normal and breath sounds normal. No wheezing, rales or rhonchi. Abdominal: Soft, nondistended, nontender. Bowel sounds active throughout. There are no masses palpable. No hepatomegaly. Extremities: no edema Neurological: Alert and oriented to person place and time. Skin: Skin is warm and dry. No rashes noted. Psychiatric: Normal mood and affect. Behavior is normal.  CBC    Component Value Date/Time   WBC 11.6 (H) 06/18/2022 1428   RBC 5.28 06/18/2022 1428   HGB 15.4 06/18/2022 1428   HCT 45.0 06/18/2022 1428   PLT 206.0 06/18/2022 1428   MCV 85.1 06/18/2022 1428   MCH 29.0 10/20/2018 1412   MCHC 34.1 06/18/2022 1428   RDW 13.8 06/18/2022 1428   LYMPHSABS 0.9 02/02/2010 1430   MONOABS 1.0 02/02/2010 1430   EOSABS 0.0 02/02/2010 1430    BASOSABS 0.0 02/02/2010 1430    CMP     Component Value Date/Time   NA 138 06/18/2022 1428   K 4.1 06/18/2022 1428   CL 103 06/18/2022 1428   CO2 23 06/18/2022 1428   GLUCOSE 208 (H) 06/18/2022 1428   BUN 12 06/18/2022 1428   CREATININE 0.96 06/18/2022 1428   CALCIUM 9.5 06/18/2022 1428   PROT 6.7 06/18/2022 1428   ALBUMIN 4.3 06/18/2022 1428   AST 47 (H) 06/18/2022 1428   ALT 47 06/18/2022 1428   ALKPHOS 52 06/18/2022 1428   BILITOT 0.7 06/18/2022 1428   GFRNONAA >60 10/20/2018 1412   GFRAA >60 10/20/2018 1412       Latest Ref Rng & Units 06/18/2022    2:28 PM 04/23/2022    4:24 PM 09/25/2021   11:13 AM  CBC EXTENDED  WBC 4.0 - 10.5 K/uL 11.6  8.2  7.8   RBC 4.22 - 5.81 Mil/uL 5.28  5.22  4.96  Hemoglobin 13.0 - 17.0 g/dL 16.1  09.6  04.5   HCT 39.0 - 52.0 % 45.0  44.3  42.8   Platelets 150.0 - 400.0 K/uL 206.0  192.0  150.0       ASSESSMENT AND PLAN:  37 year old male with likely eosinophilic esophagitis, currently with limited symptoms of dysphagia, also with chronic diarrhea and change in stool caliber.  He also has isolated episodes of nausea, vomiting and abdominal pain which are less frequent, and has fatty liver noted on imaging with mild aminotransferase elevations. He did not note any improvement in his dysphagia with a trial of Protonix.  I would like to repeat an upper endoscopy and reassess his eosinophilia burden and assess for strictures.  We will further discuss treatments for EOE after his EGD is complete. I suspect his diarrhea is likely functional, but will evaluate for celiac disease.  Given his change in stool caliber, a colonoscopy is reasonable.  Will also plan to exclude microscopic colitis as a cause of his diarrhea. In the meantime, I recommended he add a daily fiber supplement to improve his stool bulk and consistency, and to take loperamide as needed when bathroom access is not reliable. With his episodic abdominal pain with nausea and vomiting  and low ejection fraction on HIDA scan, it is possible he has biliary dyskinesia.  We discussed referral to general surgeon, but the patient declined, stating that the symptoms are not that severe, and he does not wish to pursue a cholecystectomy at this time. 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  We discussed the pathophysiology of MASLD and the risk of progression to advanced scarring and fibrosis.  We discussed the treatments of MASLD which focus on weight loss through dietary improvements and exercise.   EoE - EGD - Discuss treatment options further after EGD results.  Diarrhea/change in stool caliber, suspect functional, low suspicion for infectious etiology - Metamucil daily - Loperamide PRN - Colonoscopy with random biopsies - TTG/IgA  Fatty liver - MASLD - Counseled on role of diet and exercise in reducing risk of progression of liver disease Continue to use alcohol sparingly  Episodic nausea/vomiting and abdominal pain - Possibly biliary dyskinesia given abnormal HIDA - Patient defers surgery evaluation at this time. - No gallstones noted on ultrasound  The details, risks (including bleeding, perforation, infection, missed lesions, medication reactions and possible hospitalization or surgery if complications occur), benefits, and alternatives to EGD/colonoscopy with possible biopsy and possible polypectomy were discussed with the patient and he consents to proceed.   Sloan Takagi E. Tomasa Rand, MD [facility]   CC:  Copland, Gwenlyn Found, MD

## 2022-09-16 NOTE — Progress Notes (Signed)
[name],  Your tests for celiac disease were negative (normal).  Will see you soon for your procedures.

## 2022-09-28 ENCOUNTER — Encounter: Payer: Self-pay | Admitting: Gastroenterology

## 2022-10-10 ENCOUNTER — Encounter: Payer: Self-pay | Admitting: Gastroenterology

## 2022-10-10 ENCOUNTER — Ambulatory Visit (AMBULATORY_SURGERY_CENTER): Payer: 59 | Admitting: Gastroenterology

## 2022-10-10 VITALS — BP 129/71 | HR 70 | Temp 97.3°F | Resp 18 | Ht 75.0 in | Wt 294.0 lb

## 2022-10-10 DIAGNOSIS — R194 Change in bowel habit: Secondary | ICD-10-CM

## 2022-10-10 DIAGNOSIS — K200 Eosinophilic esophagitis: Secondary | ICD-10-CM

## 2022-10-10 DIAGNOSIS — K591 Functional diarrhea: Secondary | ICD-10-CM

## 2022-10-10 DIAGNOSIS — K2289 Other specified disease of esophagus: Secondary | ICD-10-CM

## 2022-10-10 DIAGNOSIS — K319 Disease of stomach and duodenum, unspecified: Secondary | ICD-10-CM

## 2022-10-10 MED ORDER — SODIUM CHLORIDE 0.9 % IV SOLN
500.0000 mL | Freq: Once | INTRAVENOUS | Status: DC
Start: 2022-10-10 — End: 2022-10-10

## 2022-10-10 NOTE — Op Note (Signed)
[facility] Patient Name: Jared Blake Procedure Date: 10/10/2022 2:43 PM MRN: [id number] Endoscopist: Lorin Picket E. Tomasa Rand , MD, [id number] Age: 37 Referring MD:  Date of Birth: March 02, 1985 Gender: Male Account #: [id number] Procedure:                Upper GI endoscopy Indications:              Follow-up of eosinophilic esophagitis (diagnosed                            2020, treated initially with Protonix; self-d/c'd                            due lack of improvement in symptoms; currently with                            occasional dysphagia, also with chronic diarrhea Medicines:                Monitored Anesthesia Care Procedure:                Pre-Anesthesia Assessment:                           - Prior to the procedure, a History and Physical                            was performed, and patient medications and                            allergies were reviewed. The patient's tolerance of                            previous anesthesia was also reviewed. The risks                            and benefits of the procedure and the sedation                            options and risks were discussed with the patient.                            All questions were answered, and informed consent                            was obtained. Prior Anticoagulants: The patient has                            taken no anticoagulant or antiplatelet agents. ASA                            Grade Assessment: III - A patient with severe                            systemic disease. After reviewing the risks and  benefits, the patient was deemed in satisfactory                            condition to undergo the procedure.                           After obtaining informed consent, the endoscope was                            passed under direct vision. Throughout the                            procedure, the patient's blood pressure, pulse, and                             oxygen saturations were monitored continuously. The                            Olympus Scope [id number] was introduced through the                            mouth, and advanced to the third part of duodenum.                            The upper GI endoscopy was accomplished without                            difficulty. The patient tolerated the procedure                            well. Scope In: Scope Out: Findings:                 The examined portions of the nasopharynx,                            oropharynx and larynx were normal.                           Diffuse moderate mucosal changes characterized by                            longitudinal markings, altered texture with whitish                            plaques and a decreased vascular pattern were found                            in the entire esophagus. Biopsies were obtained                            from the proximal and distal esophagus with cold                            forceps for histology of suspected eosinophilic  esophagitis. Estimated blood loss was minimal. The                            esophageal lumen seemed narrowed, but there was no                            focal stricture amenable to dilation.                           The exam of the esophagus was otherwise normal.                           Diffuse granular mucosa was found in the gastric                            body. Biopsies were taken with a cold forceps for                            Helicobacter pylori testing. Estimated blood loss                            was minimal.                           The exam of the stomach was otherwise normal.                           The examined duodenum was normal. Biopsies for                            histology were taken with a cold forceps for                            evaluation of celiac disease. Estimated blood loss                            was minimal. Complications:             No immediate complications. Estimated Blood Loss:     Estimated blood loss was minimal. Impression:               - The examined portions of the nasopharynx,                            oropharynx and larynx were normal.                           - Longitudinally marked, texture changed, decreased                            vascular pattern mucosa in the esophagus.                           - Granular gastric mucosa. Biopsied.                           -  Normal examined duodenum. Biopsied.                           - Biopsies were taken with a cold forceps for                            evaluation of eosinophilic esophagitis. Recommendation:           - Patient has a contact number available for                            emergencies. The signs and symptoms of potential                            delayed complications were discussed with the                            patient. Return to normal activities tomorrow.                            Written discharge instructions were provided to the                            patient.                           - Resume previous diet.                           - Continue present medications.                           - Await pathology results.                           - Use Prilosec (omeprazole) 20 mg PO daily                            indefinitely.                           - Follow up in the office to discuss further                            management of eosinophilic esophagitis Liya Strollo E. Tomasa Rand, MD 10/10/2022 3:25:33 PM This report has been signed electronically.

## 2022-10-10 NOTE — Progress Notes (Signed)
History and Physical Interval Note:  10/10/2022 2:42 PM  Jared Blake  has presented today for endoscopic procedure(s), with the diagnosis of  Encounter Diagnoses  Name Primary?   Eosinophilic esophagitis Yes   Functional diarrhea   .  The various methods of evaluation and treatment have been discussed with the patient and/or family. After consideration of risks, benefits and other options for treatment, the patient has consented to  the endoscopic procedure(s).   The patient's history has been reviewed, patient examined, no change in status, stable for endoscopic procedure(s).  I have reviewed the patient's chart and labs.  Questions were answered to the patient's satisfaction.     Safiatou Islam E. Tomasa Rand, MD [facility]

## 2022-10-10 NOTE — Progress Notes (Signed)
Uneventful anesthetic. Report to pacu rn. Vss. Care resumed by rn. 

## 2022-10-10 NOTE — Patient Instructions (Signed)

## 2022-10-10 NOTE — Op Note (Signed)
[facility] Patient Name: Gloria Delamater Procedure Date: 10/10/2022 2:43 PM MRN: [id number] Endoscopist: Lorin Picket E. Tomasa Rand , MD, [id number] Age: 37 Referring MD:  Date of Birth: [birth year] Gender: Male Account #: [id number] Procedure:                Colonoscopy Indications:              Change in bowel habits, Chronic diarrhea Medicines:                Monitored Anesthesia Care Procedure:                Pre-Anesthesia Assessment:                           - Prior to the procedure, a History and Physical                            was performed, and patient medications and                            allergies were reviewed. The patient's tolerance of                            previous anesthesia was also reviewed. The risks                            and benefits of the procedure and the sedation                            options and risks were discussed with the patient.                            All questions were answered, and informed consent                            was obtained. Prior Anticoagulants: The patient has                            taken no anticoagulant or antiplatelet agents. ASA                            Grade Assessment: III - A patient with severe                            systemic disease. After reviewing the risks and                            benefits, the patient was deemed in satisfactory                            condition to undergo the procedure.                           After obtaining informed consent, the colonoscope  was passed under direct vision. Throughout the                            procedure, the patient's blood pressure, pulse, and                            oxygen saturations were monitored continuously. The                            CF [id number] #[id number] was introduced through the anus                            and advanced to the the terminal ileum, with                            identification  of the appendiceal orifice and IC                            valve. The colonoscopy was performed without                            difficulty. The patient tolerated the procedure                            well. The quality of the bowel preparation was                            adequate. The terminal ileum, ileocecal valve,                            appendiceal orifice, and rectum were photographed.                            The bowel preparation used was SUPREP via split                            dose instruction. Scope In: 3:02:40 PM Scope Out: 3:16:25 PM Scope Withdrawal Time: 0 hours 10 minutes 11 seconds  Total Procedure Duration: 0 hours 13 minutes 45 seconds  Findings:                 The perianal and digital rectal examinations were                            normal. Pertinent negatives include normal                            sphincter tone and no palpable rectal lesions.                           The colon (entire examined portion) appeared                            normal. Biopsies for histology were taken with a  cold forceps from the ascending colon, transverse                            colon, descending colon and sigmoid colon for                            evaluation of microscopic colitis. Estimated blood                            loss was minimal.                           The terminal ileum appeared normal.                           The retroflexed view of the distal rectum and anal                            verge was normal and showed no anal or rectal                            abnormalities. Complications:            No immediate complications. Estimated Blood Loss:     Estimated blood loss was minimal. Impression:               - The entire examined colon is normal. Biopsied.                           - The examined portion of the ileum was normal.                           - The distal rectum and anal verge are normal on                             retroflexion view.                           - No endoscopic abnormalities to explain patient's                            change in bowel habits. Recommendation:           - Patient has a contact number available for                            emergencies. The signs and symptoms of potential                            delayed complications were discussed with the                            patient. Return to normal activities tomorrow.                            Written discharge instructions were provided  to the                            patient.                           - Resume previous diet.                           - Continue present medications.                           - Await pathology results.                           - Repeat colonoscopy in 10 years for screening                            purposes. Sheddrick Lattanzio E. Tomasa Rand, MD 10/10/2022 3:28:59 PM This report has been signed electronically.

## 2022-10-10 NOTE — Progress Notes (Signed)
Pt's states no medical or surgical changes since previsit or office visit. 

## 2022-10-10 NOTE — Progress Notes (Signed)
Called to room to assist during endoscopic procedure.  Patient ID and intended procedure confirmed with present staff. Received instructions for my participation in the procedure from the performing physician.  

## 2022-10-11 ENCOUNTER — Telehealth: Payer: Self-pay | Admitting: *Deleted

## 2022-10-11 NOTE — Telephone Encounter (Signed)
  Follow up Call-     10/10/2022    1:53 PM  Call back number  Post procedure Call Back phone  # [phone]  Permission to leave phone message Yes     Patient questions:  Do you have a fever, pain , or abdominal swelling? No. Pain Score  0 *  Have you tolerated food without any problems? Yes.    Have you been able to return to your normal activities? Yes.    Do you have any questions about your discharge instructions: Diet   No. Medications  No. Follow up visit  No.  Do you have questions or concerns about your Care? No.  Actions: * If pain score is 4 or above: No action needed, pain <4.

## 2022-10-24 NOTE — Progress Notes (Signed)
Jared Blake,  The biopsies of your stomach and duodenum were unremarkable.  The biopsies from the esophagus on your recent upper endoscopy were notable for elevated eosinophils (inflammatory cells).  The biopsy results and images during your endoscopy are consistent with Eosinophilic Esophagitis (EoE).  This is an inflammatory condition of your esophagus which is typically triggered by ingestion of certain foods (allergens) and can result in difficulty swallowing.  This can be managed through a combination of medications with or without the need for repeat endoscopy with dilation (stretching) of the esophagus as needed.  Please take the omeprazole daily for now and follow up with me in the office to discuss further treatment options (medications, dietary modification).  The biopsies of your colon were normal.  There was no evidence of microscopic colitis or other inflammatory changes.

## 2022-11-06 ENCOUNTER — Ambulatory Visit: Payer: 59 | Admitting: Gastroenterology

## 2023-01-09 ENCOUNTER — Ambulatory Visit: Payer: 59 | Admitting: Gastroenterology

## 2023-01-11 ENCOUNTER — Ambulatory Visit: Payer: 59 | Admitting: Gastroenterology

## 2023-01-11 ENCOUNTER — Encounter: Payer: Self-pay | Admitting: Gastroenterology

## 2023-01-11 VITALS — BP 122/60 | HR 86 | Ht 75.0 in | Wt 301.0 lb

## 2023-01-11 DIAGNOSIS — K200 Eosinophilic esophagitis: Secondary | ICD-10-CM

## 2023-01-11 DIAGNOSIS — K529 Noninfective gastroenteritis and colitis, unspecified: Secondary | ICD-10-CM | POA: Diagnosis not present

## 2023-01-11 DIAGNOSIS — K591 Functional diarrhea: Secondary | ICD-10-CM

## 2023-01-11 MED ORDER — OMEPRAZOLE 20 MG PO CPDR: 20.0000 mg | DELAYED_RELEASE_CAPSULE | Freq: Every day | ORAL | 3 refills | Status: AC

## 2023-01-11 MED ORDER — FLUTICASONE PROPIONATE HFA 220 MCG/ACT IN AERO: 2.0000 | INHALATION_SPRAY | Freq: Two times a day (BID) | RESPIRATORY_TRACT | 3 refills | Status: DC

## 2023-01-11 NOTE — Progress Notes (Unsigned)
HPI : Jared Blake is a 37 y.o. male who is referred to [place] by Pearline Cables, MD.       EGD Sept 4, 2024 - The examined portions of the nasopharynx, oropharynx and larynx were normal.  - Longitudinally marked, texture changed, decreased vascular pattern mucosa in the esophagus.  - Granular gastric mucosa. Biopsied.  - Normal examined duodenum. Biopsied.  - Biopsies were taken with a cold forceps for evaluation of eosinophilic esophagitis.   Colonoscopy Sept 4, 2024 - The entire examined colon is normal. Biopsied.  - The examined portion of the ileum was normal.  - The distal rectum and anal verge are normal on retroflexion view.  1. Surgical [P], duodenal bulb - DUODENAL MUCOSA WITH NO SIGNIFICANT PATHOLOGY. 2. Surgical [P], gastric anturm and gastric body - PREDOMINANTLY OXYNTIC TYPE MUCOSA WITH MILD CHEMICAL/REACTIVE CHANGE, OTHERWISE NO SIGNIFICANT PATHOLOGY. - NO HELICOBACTER PYLORI ORGANISMS IDENTIFIED ON H&E STAINED SLIDE. 3. Surgical [P], distal esophagus - SQUAMOUS MUCOSA WITH INCREASED INTRAEPITHELIAL EOSINOPHILS ((FOCALLY >30/HIGH POWER FIELD ) IN THE BACKGROUND OF EXTENSIVE BASAL CELL HYPERPLASIA AND PAPILLARY ELONGATION, SEE PART 4 4. Surgical [P], proximal esophagus - SQUAMOUS MUCOSA WITH INCREASED INTRAEPITHELIAL EOSINOPHILS (TOO NUMEROUS TO COUNT) IN THE BACKGROUND OF EXTENSIVE BASAL CELL HYPERPLASIA AND PAPILLARY ELONGATION CONSISTENT WITH EOSINOPHILIC ESOPHAGITIS IN THE APPROPRIATE ENDOSCOPIC SETTING NOTE: THERE IS A FOCAL FRAGMENT OF SUBMUCOSAL SOFT TISSUE PRESENT THAT SHOWS THE PRESENCE OF SUBEPITHELIAL FIBROSIS. 5. Surgical [P], colon nos, random sites - COLONIC MUCOSA WITHIN NORMAL LIMITS.   Past Medical History:  Diagnosis Date   Allergy    Asthma    Diabetes (HCC)    Esophagitis    Obesity    Sleep apnea      Past Surgical History:  Procedure Laterality Date   bilateral turbinnde reduction     SEPTOPLASTY     Family History   Problem Relation Age of Onset   Diabetes Mother    Diabetes Father    Irritable bowel syndrome Brother    Colon cancer Neg Hx    Stomach cancer Neg Hx    Esophageal cancer Neg Hx    Social History   Tobacco Use   Smoking status: Never   Smokeless tobacco: Never  Vaping Use   Vaping status: Never Used  Substance Use Topics   Alcohol use: Yes    Comment: social   Drug use: Never   Current Outpatient Medications  Medication Sig Dispense Refill   cetirizine (ZYRTEC) 10 MG tablet Take 10 mg by mouth daily.     metFORMIN (GLUCOPHAGE-XR) 500 MG 24 hr tablet Take 2 tablets (1,000 mg total) by mouth daily with breakfast. 180 tablet 3   Semaglutide,0.25 or 0.5MG /DOS, (OZEMPIC, 0.25 OR 0.5 MG/DOSE,) 2 MG/3ML SOPN Inject 0.5 mg into the skin once a week. 9 mL 2   No current facility-administered medications for this visit.   No Known Allergies   Review of Systems: All systems reviewed and negative except where noted in HPI.    No results found.  Physical Exam: There were no vitals taken for this visit. Constitutional: Pleasant,well-developed, ***male in no acute distress. HEENT: Normocephalic and atraumatic. Conjunctivae are normal. No scleral icterus. Neck supple.  Cardiovascular: Normal rate, regular rhythm.  Pulmonary/chest: Effort normal and breath sounds normal. No wheezing, rales or rhonchi. Abdominal: Soft, nondistended, nontender. Bowel sounds active throughout. There are no masses palpable. No hepatomegaly. Extremities: no edema Lymphadenopathy: No cervical adenopathy noted. Neurological: Alert and oriented to person place  and time. Skin: Skin is warm and dry. No rashes noted. Psychiatric: Normal mood and affect. Behavior is normal.  CBC    Component Value Date/Time   WBC 11.6 (H) 06/18/2022 1428   RBC 5.28 06/18/2022 1428   HGB 15.4 06/18/2022 1428   HCT 45.0 06/18/2022 1428   PLT 206.0 06/18/2022 1428   MCV 85.1 06/18/2022 1428   MCH 29.0 10/20/2018 1412    MCHC 34.1 06/18/2022 1428   RDW 13.8 06/18/2022 1428   LYMPHSABS 0.9 02/02/2010 1430   MONOABS 1.0 02/02/2010 1430   EOSABS 0.0 02/02/2010 1430   BASOSABS 0.0 02/02/2010 1430    CMP     Component Value Date/Time   NA 138 06/18/2022 1428   K 4.1 06/18/2022 1428   CL 103 06/18/2022 1428   CO2 23 06/18/2022 1428   GLUCOSE 208 (H) 06/18/2022 1428   BUN 12 06/18/2022 1428   CREATININE 0.96 06/18/2022 1428   CALCIUM 9.5 06/18/2022 1428   PROT 6.7 06/18/2022 1428   ALBUMIN 4.3 06/18/2022 1428   AST 47 (H) 06/18/2022 1428   ALT 47 06/18/2022 1428   ALKPHOS 52 06/18/2022 1428   BILITOT 0.7 06/18/2022 1428   GFRNONAA >60 10/20/2018 1412   GFRAA >60 10/20/2018 1412       Latest Ref Rng & Units 06/18/2022    2:28 PM 04/23/2022    4:24 PM 09/25/2021   11:13 AM  CBC EXTENDED  WBC 4.0 - 10.5 K/uL 11.6  8.2  7.8   RBC 4.22 - 5.81 Mil/uL 5.28  5.22  4.96   Hemoglobin 13.0 - 17.0 g/dL 34.7  42.5  95.6   HCT 39.0 - 52.0 % 45.0  44.3  42.8   Platelets 150.0 - 400.0 K/uL 206.0  192.0  150.0       ASSESSMENT AND PLAN:  Copland, Gwenlyn Found, MD

## 2023-01-11 NOTE — Patient Instructions (Signed)
We have sent the following medications to your pharmacy for you to pick up at your convenience: Fluticasone 220 mg , take two puffs daily and then swallow  twice daily.   You have been scheduled for an endoscopy. Please follow written instructions given to you at your visit today.  If you use inhalers (even only as needed), please bring them with you on the day of your procedure.  If you take any of the following medications, they will need to be adjusted prior to your procedure:   DO NOT TAKE 7 DAYS PRIOR TO TEST- Trulicity (dulaglutide) Ozempic, Wegovy (semaglutide) Mounjaro (tirzepatide) Bydureon Bcise (exanatide extended release)  DO NOT TAKE 1 DAY PRIOR TO YOUR TEST Rybelsus (semaglutide) Adlyxin (lixisenatide) Victoza (liraglutide)  _______________________________________________________  If your blood pressure at your visit was 140/90 or greater, please contact your primary care physician to follow up on this.  _______________________________________________________  If you are age 37 or older, your body mass index should be between 23-30. Your Body mass index is 37.62 kg/m. If this is out of the aforementioned range listed, please consider follow up with your Primary Care Provider.  If you are age 31 or younger, your body mass index should be between 19-25. Your Body mass index is 37.62 kg/m. If this is out of the aformentioned range listed, please consider follow up with your Primary Care Provider.   ________________________________________________________  The [facility] providers would like to encourage you to use [facility] to communicate with providers for non-urgent requests or questions.  Due to long hold times on the telephone, sending your provider a message by [facility] may be a faster and more efficient way to get a response.  Please allow 48 business hours for a response.  Please remember that this is for non-urgent requests.    It was a pleasure to see you  today!  Thank you for trusting me with your gastrointestinal care!    Scott E.Tomasa Rand, MD

## 2023-01-17 ENCOUNTER — Other Ambulatory Visit (HOSPITAL_COMMUNITY): Payer: Self-pay

## 2023-01-17 ENCOUNTER — Telehealth: Payer: Self-pay | Admitting: Pharmacy Technician

## 2023-01-17 ENCOUNTER — Telehealth: Payer: Self-pay | Admitting: Gastroenterology

## 2023-01-17 NOTE — Telephone Encounter (Signed)
Inbound call from patient stating fluticasone needs prior authorization. Please advise, thank you.

## 2023-01-17 NOTE — Telephone Encounter (Signed)
Pharmacy Patient Advocate Encounter   Received notification from Pt Calls Messages that prior authorization for Fluticasone Propionate HFA is required/requested.   Insurance verification completed.   The patient is insured through [employer] .   Per test claim: PA required; PA submitted to above mentioned insurance via CoverMyMeds Key/confirmation #/EOC [id number] Status is pending

## 2023-01-17 NOTE — Telephone Encounter (Signed)
PA has been submitted, and telephone encounter has been created. 

## 2023-01-18 ENCOUNTER — Other Ambulatory Visit (HOSPITAL_COMMUNITY): Payer: Self-pay

## 2023-01-18 NOTE — Telephone Encounter (Signed)
Pharmacy Patient Advocate Encounter  Received notification from [facility] that Prior Authorization for  Fluticasone Propionate HFA has been APPROVED from 01/18/23 to 01/18/24. Spoke to pharmacy to process.Copay is $15.00.    PA #/Case ID/Reference #: [id number]

## 2023-01-18 NOTE — Telephone Encounter (Signed)
Pharmacy Patient Advocate Encounter  Received notification from [facility] that Prior Authorization for Fluticasone Propionate HFA  has been APPROVED from 01/18/23 to 01/18/24. Ran test claim, Copay is $15.00. This test claim was processed through [facility]- copay amounts may vary at other pharmacies due to pharmacy/plan contracts, or as the patient moves through the different stages of their insurance plan.   PA #/Case ID/Reference #: [id number]

## 2023-01-22 ENCOUNTER — Encounter: Payer: Self-pay | Admitting: *Deleted

## 2023-01-23 ENCOUNTER — Encounter: Payer: Self-pay | Admitting: Adult Health

## 2023-01-23 ENCOUNTER — Ambulatory Visit: Payer: 59 | Admitting: Adult Health

## 2023-01-23 VITALS — BP 126/71 | HR 84 | Ht 75.0 in | Wt 290.0 lb

## 2023-01-23 DIAGNOSIS — G4733 Obstructive sleep apnea (adult) (pediatric): Secondary | ICD-10-CM | POA: Diagnosis not present

## 2023-01-23 NOTE — Patient Instructions (Addendum)
Your Plan:  Continue nightly use of CPAP for adequate sleep apnea management  Continue to follow with your DME company for any needed supplies or CPAP related concerns  Please follow up with [employer] [facility] regarding head gear concerns - please let me know if I can be of any assistance with this      Follow-up in 1 year or call earlier if needed     Thank you for coming to see [place] at [facility]. I hope we have been able to provide you high quality care today.  You may receive a patient satisfaction survey over the next few weeks. We would appreciate your feedback and comments so that we may continue to improve ourselves and the health of our patients.

## 2023-01-23 NOTE — Progress Notes (Signed)
[facility] [address] [place]. [place] [address] [phone]       OFFICE FOLLOW UP NOTE  [name] Date of Birth:  Nov 03, 1985 Medical Record Number:  [id number]   Reason for visit: CPAP follow-up    SUBJECTIVE:   CHIEF COMPLAINT:  Chief Complaint  Patient presents with   Follow-up    Patient in room #8 and alone. Patient states he I well and stable with no new concerns.    Brief HPI:   Jared Blake is a 37 y.o. male who is being followed for OSA on CPAP.  HST 09/2020 showed severe sleep apnea with total AHI 68.1/h and O2 nadir of 83%.  AutoPap initiated 10/2020 with pressure setting 6-18. DME [employer] [facility].  At prior visit, satisfactory compliance report with optimal residual AHI.  Continue current pressure settings.  ESS 6/24 (prior to CPAP 12/24).    Interval history:  Patient continues to do well with CPAP therapy.  He continues to tolerate CPAP well. He does report headgear can get stretched out easily which will require constant retightening, he does not get headgear replacements often. Reports sleeping well throughout the night and adequate daytime energy levels.  ESS 8/24.   Was not able to obtain normal compliance report but able to view past 30-day daily usage report.  Patient has been compliant over the past 30 days at 100% and greater than 4 hours per night.  Pressure ranging 5.5-7.0.  Residual AHI ranging 0.0-0.6.  No evidence of leaks.       ROS:   14 system review of systems performed and negative with exception of those listed in HPI  PMH:  Past Medical History:  Diagnosis Date   Allergy    Asthma    Diabetes (HCC)    Esophagitis    Obesity    Sleep apnea     PSH:  Past Surgical History:  Procedure Laterality Date   bilateral turbinnde reduction     SEPTOPLASTY      Social History:  Social History   Socioeconomic History   Marital status: Married    Spouse name: Not on file   Number of children: 0    Years of education: Not on file   Highest education level: Bachelor's degree (e.g., BA, AB, BS)  Occupational History   Occupation: Doctor, hospital  Tobacco Use   Smoking status: Never   Smokeless tobacco: Never  Vaping Use   Vaping status: Never Used  Substance and Sexual Activity   Alcohol use: Yes    Comment: social   Drug use: Never   Sexual activity: Not on file  Other Topics Concern   Not on file  Social History Narrative   Not on file   Social Drivers of Health   Financial Resource Strain: Medium Risk (06/13/2022)   Overall Financial Resource Strain (CARDIA)    Difficulty of Paying Living Expenses: Somewhat hard  Food Insecurity: No Food Insecurity (06/13/2022)   Hunger Vital Sign    Worried About Running Out of Food in the Last Year: Never true    Ran Out of Food in the Last Year: Never true  Transportation Needs: No Transportation Needs (06/13/2022)   PRAPARE - Administrator, Civil Service (Medical): No    Lack of Transportation (Non-Medical): No  Physical Activity: Insufficiently Active (06/13/2022)   Exercise Vital Sign    Days of Exercise per Week: 4 days    Minutes of Exercise per Session: 30  min  Stress: No Stress Concern Present (06/13/2022)   [employer] of Occupational Health - Occupational Stress Questionnaire    Feeling of Stress : Not at all  Social Connections: Moderately Isolated (06/13/2022)   Social Connection and Isolation Panel [NHANES]    Frequency of Communication with Friends and Family: More than three times a week    Frequency of Social Gatherings with Friends and Family: Twice a week    Attends Religious Services: Never    Database administrator or Organizations: No    Attends Engineer, structural: Not on file    Marital Status: Married  Catering manager Violence: Not on file    Family History:  Family History  Problem Relation Age of Onset   Diabetes Mother    Diabetes Father    Irritable bowel syndrome Brother     Colon cancer Neg Hx    Stomach cancer Neg Hx    Esophageal cancer Neg Hx     Medications:   Current Outpatient Medications on File Prior to Visit  Medication Sig Dispense Refill   cetirizine (ZYRTEC) 10 MG tablet Take 10 mg by mouth daily.     fluticasone (FLOVENT HFA) 220 MCG/ACT inhaler Inhale 2 puffs into the lungs 2 (two) times daily. Two puffs then swallow twice daily 2 each 3   metFORMIN (GLUCOPHAGE-XR) 500 MG 24 hr tablet Take 2 tablets (1,000 mg total) by mouth daily with breakfast. 180 tablet 3   omeprazole (PRILOSEC OTC) 20 MG tablet Take 20 mg by mouth daily.     omeprazole (PRILOSEC) 20 MG capsule Take 1 capsule (20 mg total) by mouth daily. 90 capsule 3   Semaglutide,0.25 or 0.5MG /DOS, (OZEMPIC, 0.25 OR 0.5 MG/DOSE,) 2 MG/3ML SOPN Inject 0.5 mg into the skin once a week. 9 mL 2   No current facility-administered medications on file prior to visit.    Allergies:  No Known Allergies    OBJECTIVE:  Physical Exam  Vitals:   01/23/23 1047  BP: 126/71  Pulse: 84  Weight: 290 lb (131.5 kg)  Height: 6\' 3"  (1.905 m)   Body mass index is 36.25 kg/m. No results found.  General: well developed, well nourished, pleasant middle-age Caucasian male, seated, in no evident distress  Neurologic Exam Mental Status: Awake and fully alert. Oriented to place and time. Recent and remote memory intact. Attention span, concentration and fund of knowledge appropriate. Mood and affect appropriate.  Cranial Nerves: Pupils equal, briskly reactive to light. Extraocular movements full without nystagmus. Visual fields full to confrontation. Hearing intact. Facial sensation intact. Face, tongue, palate moves normally and symmetrically.  Motor: Normal bulk and tone. Normal strength in all tested extremity muscles Gait and Station: Arises from chair without difficulty. Stance is normal. Gait demonstrates normal stride length and balance without use of AD.         ASSESSMENT/PLAN:  Jared Blake is a 37 y.o. year old male    OSA on CPAP : Compliance report shows excellent usage and optimal residual AHI.  Continue current pressure setting of 6-18. Advised patient to contact [employer] [facility] regarding headgear concerns and possibly getting replacements more frequently.  Continue nightly use of CPAP for adequate sleep apnea management.  Continue to follow with DME [facility] for any needed supplies or CPAP related concerns.    Follow up in 1 year or call earlier if needed   CC:  PCP: Copland, Gwenlyn Found, MD    I spent 15 minutes of face-to-face  and non-face-to-face time with patient.  This included previsit chart review, lab review, study review, order entry, electronic health record documentation, patient education regarding sleep apnea and answered all other questions to patient's satisfaction  Ihor Austin, [facility]  [place] [facility] [address] [place], [place] [address]  Phone [phone] Fax [phone] Note: This document was prepared with digital dictation and possible smart phrase technology. Any transcriptional errors that result from this process are unintentional.

## 2023-03-02 ENCOUNTER — Other Ambulatory Visit: Payer: Self-pay | Admitting: Family Medicine

## 2023-03-02 DIAGNOSIS — R7303 Prediabetes: Secondary | ICD-10-CM

## 2023-03-08 ENCOUNTER — Encounter: Payer: 59 | Admitting: Gastroenterology

## 2023-03-14 ENCOUNTER — Encounter: Payer: 59 | Admitting: Family Medicine

## 2023-03-15 ENCOUNTER — Encounter: Payer: Self-pay | Admitting: Gastroenterology

## 2023-03-25 ENCOUNTER — Encounter: Payer: Self-pay | Admitting: Gastroenterology

## 2023-03-25 ENCOUNTER — Encounter: Payer: 59 | Admitting: Family Medicine

## 2023-03-25 ENCOUNTER — Ambulatory Visit (AMBULATORY_SURGERY_CENTER): Payer: 59 | Admitting: Gastroenterology

## 2023-03-25 VITALS — BP 116/51 | HR 85 | Temp 97.7°F | Resp 16

## 2023-03-25 DIAGNOSIS — K2100 Gastro-esophageal reflux disease with esophagitis, without bleeding: Secondary | ICD-10-CM

## 2023-03-25 DIAGNOSIS — K200 Eosinophilic esophagitis: Secondary | ICD-10-CM

## 2023-03-25 MED ORDER — SODIUM CHLORIDE 0.9 % IV SOLN: 500.0000 mL | INTRAVENOUS | Status: DC

## 2023-03-25 NOTE — Progress Notes (Signed)
 Drowsy, VSS, resps reg and even. Report to RN

## 2023-03-25 NOTE — Patient Instructions (Signed)
-  await pathology results -Continue present medications     YOU HAD AN ENDOSCOPIC PROCEDURE TODAY AT THE [facility]:   Refer to the procedure report that was given to you for any specific questions about what was found during the examination.  If the procedure report does not answer your questions, please call your gastroenterologist to clarify.  If you requested that your care partner not be given the details of your procedure findings, then the procedure report has been included in a sealed envelope for you to review at your convenience later.  YOU SHOULD EXPECT: Some feelings of bloating in the abdomen. Passage of more gas than usual.  Walking can help get rid of the air that was put into your GI tract during the procedure and reduce the bloating. If you had a lower endoscopy (such as a colonoscopy or flexible sigmoidoscopy) you may notice spotting of blood in your stool or on the toilet paper. If you underwent a bowel prep for your procedure, you may not have a normal bowel movement for a few days.  Please Note:  You might notice some irritation and congestion in your nose or some drainage.  This is from the oxygen used during your procedure.  There is no need for concern and it should clear up in a day or so.  SYMPTOMS TO REPORT IMMEDIATELY:  Following upper endoscopy (EGD)  Vomiting of blood or coffee ground material  New chest pain or pain under the shoulder blades  Painful or persistently difficult swallowing  New shortness of breath  Fever of 100F or higher  Black, tarry-looking stools  For urgent or emergent issues, a gastroenterologist can be reached at any hour by calling [phone]. Do not use MyChart messaging for urgent concerns.    DIET:  We do recommend a small meal at first, but then you may proceed to your regular diet.  Drink plenty of fluids but you should avoid alcoholic beverages for 24 hours.  ACTIVITY:  You should plan to take it easy for the rest  of today and you should NOT DRIVE or use heavy machinery until tomorrow (because of the sedation medicines used during the test).    FOLLOW UP: Our staff will call the number listed on your records the next business day following your procedure.  We will call around 7:15- 8:00 am to check on you and address any questions or concerns that you may have regarding the information given to you following your procedure. If we do not reach you, we will leave a message.     If any biopsies were taken you will be contacted by phone or by letter within the next 1-3 weeks.  Please call us at [phone] if you have not heard about the biopsies in 3 weeks.    SIGNATURES/CONFIDENTIALITY: You and/or your care partner have signed paperwork which will be entered into your electronic medical record.  These signatures attest to the fact that that the information above on your After Visit Summary has been reviewed and is understood.  Full responsibility of the confidentiality of this discharge information lies with you and/or your care-partner.  

## 2023-03-25 NOTE — Telephone Encounter (Signed)
 Pt's wife called stating pt took 1/2 dose of Ozempic on 03/20/23. Discussed with MD and CRNA, plan to proceed with EGD. Pt's wife states he has not eaten since 8:30pm on 03/24/23.

## 2023-03-25 NOTE — Op Note (Signed)
 [facility] Patient Name: Jared Blake Procedure Date: 03/25/2023 9:49 AM MRN: [id number] Endoscopist: Lorin Picket E. Tomasa Rand , MD, [id number] Age: 38 Referring MD:  Date of Birth: May 20, 1985 Gender: Male Account #: [id number] Procedure:                Upper GI endoscopy Indications:              Follow-up of eosinophilic esophagitis (started                            swallowed fluticasone in Dec 2024 with some                            clinical improvement; taking once daily currently) Medicines:                Monitored Anesthesia Care Procedure:                Pre-Anesthesia Assessment:                           - Prior to the procedure, a History and Physical                            was performed, and patient medications and                            allergies were reviewed. The patient's tolerance of                            previous anesthesia was also reviewed. The risks                            and benefits of the procedure and the sedation                            options and risks were discussed with the patient.                            All questions were answered, and informed consent                            was obtained. Prior Anticoagulants: The patient has                            taken no anticoagulant or antiplatelet agents. ASA                            Grade Assessment: II - A patient with mild systemic                            disease. After reviewing the risks and benefits,                            the patient was deemed in satisfactory condition to  undergo the procedure.                           After obtaining informed consent, the endoscope was                            passed under direct vision. Throughout the                            procedure, the patient's blood pressure, pulse, and                            oxygen saturations were monitored continuously. The                            Olympus  Scope [id number] was introduced through the                            mouth, and advanced to the second part of duodenum.                            The upper GI endoscopy was accomplished without                            difficulty. The patient tolerated the procedure                            well. Scope In: Scope Out: Findings:                 The examined portions of the nasopharynx,                            oropharynx and larynx were normal.                           Mucosal changes including white specks were found                            in the entire esophagus. Esophageal findings were                            graded using the Eosinophilic Esophagitis                            Endoscopic Reference Score (EoE-EREFS) as: Edema                            Grade 1 Present (decreased clarity or absence of                            vascular markings), Rings Grade 0 None (no ridges                            or rings seen), Exudates Grade 1 Mild (scattered  white lesions involving less than 10 percent of the                            esophageal surface area), Furrows Grade 0 None (no                            vertical lines seen) and Stricture present (13 mm                            luminal diameter). Biopsies were obtained from the                            proximal and distal esophagus with cold forceps for                            histology of suspected eosinophilic esophagitis.                            Estimated blood loss was minimal. A TTS dilator was                            passed through the scope. Dilation with a                            15-16.5-18 mm balloon dilator was performed to 18                            mm. The dilation site was examined and showed mild                            mucosal disruption. Estimated blood loss was                            minimal.                           The entire examined stomach was  normal.                           The examined duodenum was normal. Complications:            No immediate complications. Estimated Blood Loss:     Estimated blood loss was minimal. Impression:               - The examined portions of the nasopharynx,                            oropharynx and larynx were normal.                           - Esophageal mucosal changes likely consistent with                            eosinophilic esophagitis, characterized by  scattered whitish exudates. This may also represent                            candidiasis. Distal stricture noted, dilated.                           - Normal stomach.                           - Normal examined duodenum.                           - Biopsies were taken with a cold forceps for                            evaluation of eosinophilic esophagitis. Recommendation:           - Patient has a contact number available for                            emergencies. The signs and symptoms of potential                            delayed complications were discussed with the                            patient. Return to normal activities tomorrow.                            Written discharge instructions were provided to the                            patient.                           - Resume previous diet.                           - Continue present medications.                           - Await pathology results. If active eosinophils,                            will increase fluticasone to twice daily; if                            candidiasis present, treat with fluconazole. Griselda Bramblett E. Tomasa Rand, MD 03/25/2023 10:32:17 AM This report has been signed electronically.

## 2023-03-25 NOTE — Progress Notes (Signed)
 [facility] History and Physical   Primary Care Physician:  Pearline Cables, MD   Reason for Procedure:   Follow up eosinophilic esophagitis  Plan:    EGD     HPI: Jared Blake is a 38 y.o. male undergoing repeat EGD to assess response to swallowed fluticasone. He has been taking the fluticasone once daily since December and feels that there has been improvement in his swallowing.  He is also taking omeprazole once daily.    Past Medical History:  Diagnosis Date   Allergy    Asthma    Diabetes (HCC)    Esophagitis    Obesity    Sleep apnea     Past Surgical History:  Procedure Laterality Date   bilateral turbinnde reduction     SEPTOPLASTY      Prior to Admission medications   Medication Sig Start Date End Date Taking? Authorizing Provider  cetirizine (ZYRTEC) 10 MG tablet Take 10 mg by mouth daily.   Yes [provider]  fluticasone (FLOVENT HFA) 220 MCG/ACT inhaler Inhale 2 puffs into the lungs 2 (two) times daily. Two puffs then swallow twice daily 01/11/23  Yes Jenel Lucks, MD  metFORMIN (GLUCOPHAGE-XR) 500 MG 24 hr tablet TAKE 2 TABLETS BY MOUTH EVERY DAY WITH BREAKFAST 03/04/23  Yes Copland, Gwenlyn Found, MD  omeprazole (PRILOSEC) 20 MG capsule Take 1 capsule (20 mg total) by mouth daily. 01/11/23  Yes Jenel Lucks, MD  AUVI-Q 0.3 MG/0.3ML SOAJ injection Inject 0.3 mg into the muscle as needed. 12/17/22   [provider]  omeprazole (PRILOSEC OTC) 20 MG tablet Take 20 mg by mouth daily.    [provider]  Semaglutide,0.25 or 0.5MG /DOS, (OZEMPIC, 0.25 OR 0.5 MG/DOSE,) 2 MG/3ML SOPN Inject 0.5 mg into the skin once a week. 04/24/22   Copland, Gwenlyn Found, MD    Current Outpatient Medications  Medication Sig Dispense Refill   cetirizine (ZYRTEC) 10 MG tablet Take 10 mg by mouth daily.     fluticasone (FLOVENT HFA) 220 MCG/ACT inhaler Inhale 2 puffs into the lungs 2 (two) times daily. Two puffs then swallow twice  daily 2 each 3   metFORMIN (GLUCOPHAGE-XR) 500 MG 24 hr tablet TAKE 2 TABLETS BY MOUTH EVERY DAY WITH BREAKFAST 180 tablet 1   omeprazole (PRILOSEC) 20 MG capsule Take 1 capsule (20 mg total) by mouth daily. 90 capsule 3   AUVI-Q 0.3 MG/0.3ML SOAJ injection Inject 0.3 mg into the muscle as needed.     omeprazole (PRILOSEC OTC) 20 MG tablet Take 20 mg by mouth daily.     Semaglutide,0.25 or 0.5MG /DOS, (OZEMPIC, 0.25 OR 0.5 MG/DOSE,) 2 MG/3ML SOPN Inject 0.5 mg into the skin once a week. 9 mL 2   Current Facility-Administered Medications  Medication Dose Route Frequency Provider Last Rate Last Admin   0.9 %  sodium chloride infusion  500 mL Intravenous Continuous Jenel Lucks, MD        Allergies as of 03/25/2023   (No Known Allergies)    Family History  Problem Relation Age of Onset   Diabetes Mother    Diabetes Father    Irritable bowel syndrome Brother    Colon cancer Neg Hx    Stomach cancer Neg Hx    Esophageal cancer Neg Hx     Social History   Socioeconomic History   Marital status: Married    Spouse name: Not on file   Number of children: 0   Years of education:  Not on file   Highest education level: Bachelor's degree (e.g., BA, AB, BS)  Occupational History   Occupation: Doctor, hospital  Tobacco Use   Smoking status: Never   Smokeless tobacco: Never  Vaping Use   Vaping status: Never Used  Substance and Sexual Activity   Alcohol use: Yes    Comment: social   Drug use: Never   Sexual activity: Not on file  Other Topics Concern   Not on file  Social History Narrative   Not on file   Social Drivers of Health   Financial Resource Strain: Low Risk  (03/20/2023)   Overall Financial Resource Strain (CARDIA)    Difficulty of Paying Living Expenses: Not very hard  Food Insecurity: No Food Insecurity (03/20/2023)   Hunger Vital Sign    Worried About Running Out of Food in the Last Year: Never true    Ran Out of Food in the Last Year: Never true   Transportation Needs: No Transportation Needs (03/20/2023)   PRAPARE - Administrator, Civil Service (Medical): No    Lack of Transportation (Non-Medical): No  Physical Activity: Sufficiently Active (03/20/2023)   Exercise Vital Sign    Days of Exercise per Week: 3 days    Minutes of Exercise per Session: 60 min  Stress: No Stress Concern Present (03/20/2023)   [employer] of Occupational Health - Occupational Stress Questionnaire    Feeling of Stress : Only a little  Social Connections: Moderately Isolated (03/20/2023)   Social Connection and Isolation Panel [NHANES]    Frequency of Communication with Friends and Family: Once a week    Frequency of Social Gatherings with Friends and Family: More than three times a week    Attends Religious Services: Never    Database administrator or Organizations: No    Attends Engineer, structural: Not on file    Marital Status: Married  Catering manager Violence: Not on file    Review of Systems:  All other review of systems negative except as mentioned in the HPI.  Physical Exam: Vital signs BP (!) 148/81   Pulse 85   Temp 97.7 F (36.5 C) (Temporal)   SpO2 97%   General:   Alert,  Well-developed, well-nourished, pleasant and cooperative in NAD Airway:  Mallampati 1 Lungs:  Clear throughout to auscultation.   Heart:  Regular rate and rhythm; no murmurs, clicks, rubs,  or gallops. Abdomen:  Soft, nontender and nondistended. Normal bowel sounds.   Neuro/Psych:  Normal mood and affect. A and O x 3   Sherrick Araki E. Tomasa Rand, MD [facility]

## 2023-03-26 ENCOUNTER — Telehealth: Payer: Self-pay

## 2023-03-26 NOTE — Telephone Encounter (Signed)
 No answer, unable to leave a message, B.Kytzia Gienger RN.

## 2023-03-27 ENCOUNTER — Encounter: Payer: 59 | Admitting: Family Medicine

## 2023-03-27 LAB — SURGICAL PATHOLOGY

## 2023-03-28 ENCOUNTER — Encounter: Payer: Self-pay | Admitting: Gastroenterology

## 2023-03-28 NOTE — Progress Notes (Signed)
 Mr. Mittleman,  The biopsies of your esophagus showed improvement in the eosinophilia.  There were some mild changes suggestive of acid reflux, but no evidence of active eosinophilic esophagitis.  Additionally, there is no evidence of a fungal infection. I would recommend you continue your current medications of swallowed fluconazole once daily and omeprazole once daily. Hopefully, the dilation will help with your swallowing. Please follow-up with me in the office in 1 year, sooner if your symptoms change/worsen.

## 2023-03-30 NOTE — Progress Notes (Unsigned)
 [facility] [address], [address] [place], [place] [address] [phone]  Date:  04/03/2023   Name:  [name]   DOB:  04-22-1985   MRN:  [id number]  PCP:  Pearline Cables, MD    Chief Complaint: No chief complaint on file.   History of Present Illness:  Jared Blake is a 38 y.o. very pleasant male patient who presents with the following:  Patient seen today for physical exam and health maintenance-history of diabetes, sleep apnea, obesity Most recent visit with myself was in May at which time he had recently been ill with diarrhea after international travel  Flu vaccine COVID booster Update lab work today Recommend dose of pneumonia vaccine Eye exam Foot exam can be updated  Metformin XR, 1000 mg daily Ozempic 0.5 Prilosec  Lab Results  Component Value Date   HGBA1C 6.0 04/23/2022     Patient Active Problem List   Diagnosis Date Noted   Dyslipidemia 09/25/2021   Uncontrolled type 2 diabetes mellitus with hyperglycemia (HCC) 09/21/2021   OSA on CPAP 01/03/2021   Severe obstructive sleep apnea-hypopnea syndrome 09/26/2020   Morbid obesity (HCC) 08/23/2020   Loud snoring 08/23/2020   Status post nasal surgery 08/23/2020    Past Medical History:  Diagnosis Date   Allergy    Asthma    Diabetes (HCC)    Esophagitis    Obesity    Sleep apnea     Past Surgical History:  Procedure Laterality Date   bilateral turbinnde reduction     SEPTOPLASTY      Social History   Tobacco Use   Smoking status: Never   Smokeless tobacco: Never  Vaping Use   Vaping status: Never Used  Substance Use Topics   Alcohol use: Yes    Comment: social   Drug use: Never    Family History  Problem Relation Age of Onset   Diabetes Mother    Diabetes Father    Irritable bowel syndrome Brother    Colon cancer Neg Hx    Stomach cancer Neg Hx    Esophageal cancer Neg Hx     No Known  Allergies  Medication list has been reviewed and updated.  Current Outpatient Medications on File Prior to Visit  Medication Sig Dispense Refill   AUVI-Q 0.3 MG/0.3ML SOAJ injection Inject 0.3 mg into the muscle as needed.     cetirizine (ZYRTEC) 10 MG tablet Take 10 mg by mouth daily.     fluticasone (FLOVENT HFA) 220 MCG/ACT inhaler Inhale 2 puffs into the lungs 2 (two) times daily. Two puffs then swallow twice daily 2 each 3   metFORMIN (GLUCOPHAGE-XR) 500 MG 24 hr tablet TAKE 2 TABLETS BY MOUTH EVERY DAY WITH BREAKFAST 180 tablet 1   omeprazole (PRILOSEC) 20 MG capsule Take 1 capsule (20 mg total) by mouth daily. 90 capsule 3   Semaglutide,0.25 or 0.5MG /DOS, (OZEMPIC, 0.25 OR 0.5 MG/DOSE,) 2 MG/3ML SOPN Inject 0.5 mg into the skin once a week. 9 mL 2   No current facility-administered medications on file prior to visit.    Review of Systems:  As per HPI- otherwise negative.   Physical Examination: There were no vitals filed for this visit. There were no vitals filed for this visit. There is no height or weight on file to calculate BMI. Ideal Body Weight:    GEN: no acute distress. HEENT: Atraumatic, Normocephalic.  Ears and Nose: No external  deformity. CV: RRR, No M/G/R. No JVD. No thrill. No extra heart sounds. PULM: CTA B, no wheezes, crackles, rhonchi. No retractions. No resp. distress. No accessory muscle use. ABD: S, NT, ND, +BS. No rebound. No HSM. EXTR: No c/c/e PSYCH: Normally interactive. Conversant.  Foot exam  Assessment and Plan: *** Physical exam today.  Encouraged healthy diet and exercise routine Will plan further follow- up pending labs.  Signed Abbe Amsterdam, MD

## 2023-03-30 NOTE — Patient Instructions (Signed)
 It was great to see you today, I will be in touch with your labs  Recommend COVID booster if none the last 6 months or so  Recommend annual eye exam Pneumonia vaccine given today- all set until age 38!  Let's try Mounjaro instead of ozempic for a month and see if you tolerate any more easily.  Same thing- one shot weekly.  Start at 2.5 mg and we can increase monthly to max of 15 as needed and tolerated

## 2023-04-03 ENCOUNTER — Encounter: Payer: Self-pay | Admitting: Family Medicine

## 2023-04-03 ENCOUNTER — Ambulatory Visit (INDEPENDENT_AMBULATORY_CARE_PROVIDER_SITE_OTHER): Payer: 59 | Admitting: Family Medicine

## 2023-04-03 VITALS — BP 130/85 | HR 95 | Temp 98.0°F | Resp 18 | Ht 75.5 in | Wt 291.0 lb

## 2023-04-03 DIAGNOSIS — Z0000 Encounter for general adult medical examination without abnormal findings: Secondary | ICD-10-CM

## 2023-04-03 DIAGNOSIS — G4733 Obstructive sleep apnea (adult) (pediatric): Secondary | ICD-10-CM | POA: Diagnosis not present

## 2023-04-03 DIAGNOSIS — E1165 Type 2 diabetes mellitus with hyperglycemia: Secondary | ICD-10-CM

## 2023-04-03 DIAGNOSIS — Z23 Encounter for immunization: Secondary | ICD-10-CM

## 2023-04-03 DIAGNOSIS — E785 Hyperlipidemia, unspecified: Secondary | ICD-10-CM

## 2023-04-03 DIAGNOSIS — Z130 Encounter for screening for diseases of the blood and blood-forming organs and certain disorders involving the immune mechanism: Secondary | ICD-10-CM | POA: Diagnosis not present

## 2023-04-03 DIAGNOSIS — R7303 Prediabetes: Secondary | ICD-10-CM

## 2023-04-03 DIAGNOSIS — Z7985 Long-term (current) use of injectable non-insulin antidiabetic drugs: Secondary | ICD-10-CM

## 2023-04-03 DIAGNOSIS — Z7984 Long term (current) use of oral hypoglycemic drugs: Secondary | ICD-10-CM

## 2023-04-03 LAB — LIPID PANEL
Cholesterol: 152 mg/dL (ref 0–200)
HDL: 35.5 mg/dL — ABNORMAL LOW (ref >39.00)
LDL Cholesterol: 72 mg/dL (ref 0–99)
NonHDL: 116.15
Total CHOL/HDL Ratio: 4
Triglycerides: 222 mg/dL — ABNORMAL HIGH (ref 0.0–149.0)
VLDL: 44.4 mg/dL — ABNORMAL HIGH (ref 0.0–40.0)

## 2023-04-03 LAB — COMPREHENSIVE METABOLIC PANEL
ALT: 79 U/L — ABNORMAL HIGH (ref 0–53)
AST: 70 U/L — ABNORMAL HIGH (ref 0–37)
Albumin: 4.7 g/dL (ref 3.5–5.2)
Alkaline Phosphatase: 67 U/L (ref 39–117)
BUN: 12 mg/dL (ref 6–23)
CO2: 27 meq/L (ref 19–32)
Calcium: 9.9 mg/dL (ref 8.4–10.5)
Chloride: 100 meq/L (ref 96–112)
Creatinine, Ser: 0.94 mg/dL (ref 0.40–1.50)
GFR: 103.28 mL/min (ref >60.00)
Glucose, Bld: 167 mg/dL — ABNORMAL HIGH (ref 70–99)
Potassium: 4.6 meq/L (ref 3.5–5.1)
Sodium: 137 meq/L (ref 135–145)
Total Bilirubin: 1.1 mg/dL (ref 0.2–1.2)
Total Protein: 7.5 g/dL (ref 6.0–8.3)

## 2023-04-03 LAB — CBC
HCT: 45.5 % (ref 39.0–52.0)
Hemoglobin: 15.7 g/dL (ref 13.0–17.0)
MCHC: 34.5 g/dL (ref 30.0–36.0)
MCV: 85.3 fl (ref 78.0–100.0)
Platelets: 150 10*3/uL (ref 150.0–400.0)
RBC: 5.34 Mil/uL (ref 4.22–5.81)
RDW: 14 % (ref 11.5–15.5)
WBC: 6.8 10*3/uL (ref 4.0–10.5)

## 2023-04-03 LAB — HEMOGLOBIN A1C: Hgb A1c MFr Bld: 9.5 % — ABNORMAL HIGH (ref 4.6–6.5)

## 2023-04-03 LAB — MICROALBUMIN / CREATININE URINE RATIO
Creatinine,U: 238.4 mg/dL
Microalb Creat Ratio: 6.1 mg/g (ref 0.0–30.0)
Microalb, Ur: 1.5 mg/dL (ref 0.0–1.9)

## 2023-04-03 MED ORDER — METFORMIN HCL ER 500 MG PO TB24: 1000.0000 mg | ORAL_TABLET | Freq: Every day | ORAL | 3 refills | Status: DC

## 2023-04-03 MED ORDER — TIRZEPATIDE 2.5 MG/0.5ML ~~LOC~~ SOAJ: 2.5000 mg | SUBCUTANEOUS | Status: DC

## 2023-04-04 ENCOUNTER — Encounter: Payer: Self-pay | Admitting: Family Medicine

## 2023-04-04 DIAGNOSIS — R7401 Elevation of levels of liver transaminase levels: Secondary | ICD-10-CM

## 2023-04-16 ENCOUNTER — Ambulatory Visit (HOSPITAL_BASED_OUTPATIENT_CLINIC_OR_DEPARTMENT_OTHER)
Admission: RE | Admit: 2023-04-16 | Discharge: 2023-04-16 | Disposition: A | Discharge status: Home / Self Care | Payer: 59 | Source: Ambulatory Visit | Attending: Family Medicine | Admitting: Family Medicine

## 2023-04-16 DIAGNOSIS — R7401 Elevation of levels of liver transaminase levels: Secondary | ICD-10-CM | POA: Diagnosis present

## 2023-04-17 ENCOUNTER — Encounter: Payer: Self-pay | Admitting: Family Medicine

## 2023-04-25 ENCOUNTER — Encounter: Payer: Self-pay | Admitting: Family Medicine

## 2023-04-25 DIAGNOSIS — E66812 Obesity, class 2: Secondary | ICD-10-CM

## 2023-04-25 DIAGNOSIS — E1165 Type 2 diabetes mellitus with hyperglycemia: Secondary | ICD-10-CM

## 2023-04-25 MED ORDER — TIRZEPATIDE 5 MG/0.5ML ~~LOC~~ SOAJ: 5.0000 mg | SUBCUTANEOUS | 2 refills | Status: DC

## 2023-04-25 NOTE — Addendum Note (Signed)
 Addended by: Abbe Amsterdam C on: 04/25/2023 12:42 PM   Modules accepted: Orders

## 2023-05-13 ENCOUNTER — Telehealth: Payer: Self-pay

## 2023-05-13 ENCOUNTER — Other Ambulatory Visit (HOSPITAL_COMMUNITY): Payer: Self-pay

## 2023-05-13 NOTE — Telephone Encounter (Signed)
 Pharmacy Patient Advocate Encounter   Received notification from CoverMyMeds that prior authorization for Fluticasone Propionate HFA 220MCG/ACT aerosol is required/requested.   Insurance verification completed.   The patient is insured through [employer] .   Per test claim: PA required; PA submitted to above mentioned insurance via CoverMyMeds Key/confirmation #/EOC [id number] Status is pending

## 2023-05-13 NOTE — Telephone Encounter (Signed)
 Pharmacy Patient Advocate Encounter   Received notification from CoverMyMeds that prior authorization for Fluticasone Propionate HFA 220MCG/ACT aerosol is required/requested.   Insurance verification completed.   The patient is insured through [employer] [facility] .   Per test claim: PA required; PA started via CoverMyMeds. KEY [id number] . Waiting for clinical questions to populate.

## 2023-05-14 NOTE — Telephone Encounter (Signed)
 Pharmacy Patient Advocate Encounter  Received notification from [employer] [facility] that Prior Authorization for Fluticasone Propionate HFA 220MCG/ACT aerosol has been APPROVED from 05-14-2023 to 05-13-2024   PA #/Case ID/Reference #: [id number]

## 2023-09-04 ENCOUNTER — Encounter: Payer: Self-pay | Admitting: Family Medicine

## 2023-09-04 DIAGNOSIS — E1165 Type 2 diabetes mellitus with hyperglycemia: Secondary | ICD-10-CM

## 2023-09-04 MED ORDER — TIRZEPATIDE 5 MG/0.5ML ~~LOC~~ SOAJ
5.0000 mg | SUBCUTANEOUS | 0 refills | Status: DC
Start: 2023-09-04 — End: 2023-10-16

## 2023-10-10 ENCOUNTER — Ambulatory Visit: Admitting: Family Medicine

## 2023-10-15 NOTE — Patient Instructions (Incomplete)
 It was good to see you again today, I will be in touch with your labs due to diabetes I do recommend a flu shot this fall as well as a COVID-19 booster

## 2023-10-15 NOTE — Progress Notes (Signed)
 Therapist, [facility] [address], [address] [place], [place] [address] [phone]  Date:  10/16/2023   Name:  Jared Blake   DOB:  January 28, 1986   MRN:  [id number]  PCP:  Jared Harlene BROCKS, MD    Chief Complaint: No chief complaint on file.   History of Present Illness:  Jared Blake is a 38 y.o. very pleasant male patient who presents with the following:  Patient is seen today for a follow-up visit.  I saw him most recently in February for his physical -history of diabetes, sleep apnea, obesity, history of eosinophilic esophagitis which had improved on most recent upper GI scope We changed him over from Ozempic  to Mounjaro  in February due to side effect He has tolerated this well overall, currently on 5 mg In addition he uses metformin  1000 XR once daily  Needs foot exam Flu shot-get today Recommend COVID booster this fall A1c can be updated Eye exam- he will do this when he can but he does not have vision insurance  Labs in February also showed transaminitis and he is not on cholesterol medication Lab Results  Component Value Date   HGBA1C 8.4 (H) 10/16/2023     Discussed the use of AI scribe software for clinical note transcription with the patient, who gave verbal consent to proceed.  History of Present Illness Jared Blake is a 38 year old male with type 2 diabetes who presents for medication management.  He has been managing type 2 diabetes and was switched from Ozempic  to Montajaro. He is having tolerability concerns with Mounjaro . He previously experienced nausea with Ozempic  but found it manageable when doses were consistent.  We talked about trying oral rybelsus  and he would like to give this a try   He has a history of esophagitis. He takes omeprazole  and swallowed fluticasone  daily.  He experienced a recent episode of poison ivy exposure about a month and a half ago, which affected his legs and has  since developed into a scaly rash resembling eczema. He has a history of dyshidrotic eczema on his hands, which flares occasionally. He uses triamcinolone and moisturizers to manage the eczema.  He does not regularly check his blood sugar at home and has not had his eyes checked in the past year. He engages in physical activity primarily through walking, often with his three dogs. His weight has remained stable.  Wt Readings from Last 3 Encounters:  10/16/23 290 lb 12.8 oz (131.9 kg)  04/03/23 291 lb (132 kg)  01/23/23 290 lb (131.5 kg)    Patient Active Problem List   Diagnosis Date Noted   Dyslipidemia 09/25/2021   Uncontrolled type 2 diabetes mellitus with hyperglycemia (HCC) 09/21/2021   OSA on CPAP 01/03/2021   Severe obstructive sleep apnea-hypopnea syndrome 09/26/2020   Morbid obesity (HCC) 08/23/2020   Status post nasal surgery 08/23/2020    Past Medical History:  Diagnosis Date   Allergy    Asthma    Diabetes (HCC)    Esophagitis    Obesity    Sleep apnea     Past Surgical History:  Procedure Laterality Date   bilateral turbinnde reduction     SEPTOPLASTY      Social History   Tobacco Use   Smoking status: Never   Smokeless tobacco: Never  Vaping Use   Vaping status: Never Used  Substance Use Topics   Alcohol use: Yes  Comment: social   Drug use: Never    Family History  Problem Relation Age of Onset   Diabetes Mother    Diabetes Father    Irritable bowel syndrome Brother    Colon cancer Neg Hx    Stomach cancer Neg Hx    Esophageal cancer Neg Hx     No Known Allergies  Medication list has been reviewed and updated.  Current Outpatient Medications on File Prior to Visit  Medication Sig Dispense Refill   cetirizine (ZYRTEC) 10 MG tablet Take 10 mg by mouth daily.     fluticasone  (FLOVENT  HFA) 220 MCG/ACT inhaler Inhale 2 puffs into the lungs 2 (two) times daily. Two puffs then swallow twice daily 2 each 3   metFORMIN  (GLUCOPHAGE -XR) 500 MG  24 hr tablet Take 2 tablets (1,000 mg total) by mouth daily with breakfast. 180 tablet 3   omeprazole  (PRILOSEC) 20 MG capsule Take 1 capsule (20 mg total) by mouth daily. 90 capsule 3   No current facility-administered medications on file prior to visit.    Review of Systems:  As per HPI- otherwise negative.   Physical Examination: Vitals:   10/16/23 1037 10/16/23 1815  BP: (!) 147/72 (!) 140/75  Pulse: 78    Vitals:   10/16/23 1037  Weight: 290 lb 12.8 oz (131.9 kg)  Height: 6' 3.5 (1.918 m)   Body mass index is 35.87 kg/m. Ideal Body Weight: Weight in (lb) to have BMI = 25: 202.3  GEN: no acute distress.  Obese, looks well HEENT: Atraumatic, Normocephalic. Bilateral TM wnl, oropharynx normal.  PEERL,EOMI.   Ears and Nose: No external deformity. CV: RRR, No M/G/R. No JVD. No thrill. No extra heart sounds. PULM: CTA B, no wheezes, crackles, rhonchi. No retractions. No resp. distress. No accessory muscle use. ABD: S, NT, ND, +BS. No rebound. No HSM. EXTR: No c/c/e PSYCH: Normally interactive. Conversant.  Foot exam- normal today   Assessment and Plan: Uncontrolled type 2 diabetes mellitus with hyperglycemia (HCC) - Plan: Comprehensive metabolic panel with GFR, Hemoglobin A1c, Semaglutide  (RYBELSUS ) 7 MG TABS  Obstructive sleep apnea syndrome - Plan: CBC  Class 2 severe obesity due to excess calories with serious comorbidity and body mass index (BMI) of 36.0 to 36.9 in adult (HCC)  Transaminitis - Plan: Comprehensive metabolic panel with GFR  Dyslipidemia - Plan: Lipid panel  Need for influenza vaccination  Immunization due - Plan: Flu vaccine trivalent PF, 6mos and older(Flulaval,Afluria,Fluarix,Fluzone)  Assessment & Plan Type 2 diabetes mellitus with hyperglycemia Suboptimal response to Montajaro. Previous better tolerance with Ozempic . Considering switch to oral semaglutide  (Rybelsus ) for improved adherence and reduced nausea.  He would like to give this a  try - Prescribe oral semaglutide  (Rybelsus ) 7 mg daily. - Check hemoglobin A1c and other blood work. - Instruct to not fill prescription if cost is prohibitive and to contact for alternative plan.  Obesity, class 2 Weight stable. Engages in regular walking exercise. Discussed potential benefits of oral semaglutide  for weight management.  Dyshidrotic eczema of hands and eczematous dermatitis of legs (post-poison ivy) Eczematous dermatitis on legs post-poison ivy exposure. Dyshidrotic eczema on hands with occasional flares. Current management with triamcinolone and moisturizers. - Advise moisturizing affected areas multiple times daily. - Apply triamcinolone once daily for a couple of weeks to affected areas.  Allergic rhinitis (on allergy immunotherapy) Continues allergy immunotherapy with uncertain efficacy. Concurrent use of aggressive antihistamine regimen.  Esophagitis Well-managed with omeprazole  and swallowed fluticasone . - Continue omeprazole  and swallowed fluticasone  daily.  Signed Harlene Schroeder, MD Received labs, message to patient Results for orders placed or performed in visit on 10/16/23  CBC   Collection Time: 10/16/23 11:04 AM  Result Value Ref Range   WBC 6.4 4.0 - 10.5 K/uL   RBC 5.27 4.22 - 5.81 Mil/uL   Platelets 152.0 150.0 - 400.0 K/uL   Hemoglobin 15.2 13.0 - 17.0 g/dL   HCT 54.8 60.9 - 47.9 %   MCV 85.4 78.0 - 100.0 fl   MCHC 33.7 30.0 - 36.0 g/dL   RDW 85.7 88.4 - 84.4 %  Comprehensive metabolic panel with GFR   Collection Time: 10/16/23 11:04 AM  Result Value Ref Range   Sodium 140 135 - 145 mEq/L   Potassium 4.3 3.5 - 5.1 mEq/L   Chloride 101 96 - 112 mEq/L   CO2 28 19 - 32 mEq/L   Glucose, Bld 151 (H) 70 - 99 mg/dL   BUN 9 6 - 23 mg/dL   Creatinine, Ser 9.15 0.40 - 1.50 mg/dL   Total Bilirubin 1.1 0.2 - 1.2 mg/dL   Alkaline Phosphatase 60 39 - 117 U/L   AST 54 (H) 0 - 37 U/L   ALT 67 (H) 0 - 53 U/L   Total Protein 7.4 6.0 - 8.3 g/dL    Albumin 4.8 3.5 - 5.2 g/dL   GFR 889.30 >39.99 mL/min   Calcium 10.0 8.4 - 10.5 mg/dL  Hemoglobin J8r   Collection Time: 10/16/23 11:04 AM  Result Value Ref Range   Hgb A1c MFr Bld 8.4 (H) 4.6 - 6.5 %  Lipid panel   Collection Time: 10/16/23 11:04 AM  Result Value Ref Range   Cholesterol 158 0 - 200 mg/dL   Triglycerides 727.9 (H) 0.0 - 149.0 mg/dL   HDL 63.79 (L) >60.99 mg/dL   VLDL 45.5 (H) 0.0 - 59.9 mg/dL   LDL Cholesterol 67 0 - 99 mg/dL   Total CHOL/HDL Ratio 4    NonHDL 121.71     "

## 2023-10-16 ENCOUNTER — Ambulatory Visit: Admitting: Family Medicine

## 2023-10-16 ENCOUNTER — Encounter: Payer: Self-pay | Admitting: Family Medicine

## 2023-10-16 VITALS — BP 140/75 | HR 78 | Ht 75.5 in | Wt 290.8 lb

## 2023-10-16 DIAGNOSIS — Z7985 Long-term (current) use of injectable non-insulin antidiabetic drugs: Secondary | ICD-10-CM

## 2023-10-16 DIAGNOSIS — Z23 Encounter for immunization: Secondary | ICD-10-CM

## 2023-10-16 DIAGNOSIS — E785 Hyperlipidemia, unspecified: Secondary | ICD-10-CM

## 2023-10-16 DIAGNOSIS — R7401 Elevation of levels of liver transaminase levels: Secondary | ICD-10-CM | POA: Diagnosis not present

## 2023-10-16 DIAGNOSIS — E1165 Type 2 diabetes mellitus with hyperglycemia: Secondary | ICD-10-CM | POA: Diagnosis not present

## 2023-10-16 DIAGNOSIS — Z6836 Body mass index (BMI) 36.0-36.9, adult: Secondary | ICD-10-CM

## 2023-10-16 DIAGNOSIS — G4733 Obstructive sleep apnea (adult) (pediatric): Secondary | ICD-10-CM

## 2023-10-16 DIAGNOSIS — E66812 Obesity, class 2: Secondary | ICD-10-CM

## 2023-10-16 LAB — COMPREHENSIVE METABOLIC PANEL WITH GFR
ALT: 67 U/L — ABNORMAL HIGH (ref 0–53)
AST: 54 U/L — ABNORMAL HIGH (ref 0–37)
Albumin: 4.8 g/dL (ref 3.5–5.2)
Alkaline Phosphatase: 60 U/L (ref 39–117)
BUN: 9 mg/dL (ref 6–23)
CO2: 28 meq/L (ref 19–32)
Calcium: 10 mg/dL (ref 8.4–10.5)
Chloride: 101 meq/L (ref 96–112)
Creatinine, Ser: 0.84 mg/dL (ref 0.40–1.50)
GFR: 110.69 mL/min (ref >60.00)
Glucose, Bld: 151 mg/dL — ABNORMAL HIGH (ref 70–99)
Potassium: 4.3 meq/L (ref 3.5–5.1)
Sodium: 140 meq/L (ref 135–145)
Total Bilirubin: 1.1 mg/dL (ref 0.2–1.2)
Total Protein: 7.4 g/dL (ref 6.0–8.3)

## 2023-10-16 LAB — LIPID PANEL
Cholesterol: 158 mg/dL (ref 0–200)
HDL: 36.2 mg/dL — ABNORMAL LOW (ref >39.00)
LDL Cholesterol: 67 mg/dL (ref 0–99)
NonHDL: 121.71
Total CHOL/HDL Ratio: 4
Triglycerides: 272 mg/dL — ABNORMAL HIGH (ref 0.0–149.0)
VLDL: 54.4 mg/dL — ABNORMAL HIGH (ref 0.0–40.0)

## 2023-10-16 LAB — CBC
HCT: 45.1 % (ref 39.0–52.0)
Hemoglobin: 15.2 g/dL (ref 13.0–17.0)
MCHC: 33.7 g/dL (ref 30.0–36.0)
MCV: 85.4 fl (ref 78.0–100.0)
Platelets: 152 K/uL (ref 150.0–400.0)
RBC: 5.27 Mil/uL (ref 4.22–5.81)
RDW: 14.2 % (ref 11.5–15.5)
WBC: 6.4 K/uL (ref 4.0–10.5)

## 2023-10-16 LAB — HEMOGLOBIN A1C: Hgb A1c MFr Bld: 8.4 % — ABNORMAL HIGH (ref 4.6–6.5)

## 2023-10-16 MED ORDER — RYBELSUS 7 MG PO TABS: 7.0000 mg | ORAL_TABLET | Freq: Every day | ORAL | 3 refills | Status: DC

## 2023-10-21 ENCOUNTER — Other Ambulatory Visit: Payer: Self-pay | Admitting: Gastroenterology

## 2023-12-17 NOTE — Progress Notes (Signed)
 [facility] [address], [address] [place], [place] [address] [phone]  Date:  12/19/2023   Name:  Jared Blake   DOB:  11/04/1985   MRN:  [id number]  PCP:  [name], MD    Chief Complaint: Cough (Cough and sore throat onset 2 and a half weeks my sinuses have really been bothering me. My sore throat started about a week into it /Flu, COVID and strep negative 12/06/2023)   History of Present Illness:  Jared Blake is a 38 y.o. very pleasant male patient who presents with the following:  Patient seen today with concern of a cough.  I saw him most recently in September for follow-up of diabetes -history of diabetes, sleep apnea, obesity, history of eosinophilic esophagitis which had improved on most recent upper GI scope  We have tried to use a GLP-1 for him but he has had difficulty tolerating Ozempic  and Mounjaro .  We tried Rybelsus  in September He has history of elevated LFTs, likely due to fatty liver He was seen in urgent care on 10/31 for sore throat  He noted onset of ST and sinus congestion about 2.5 weeks ago He then started coughing up some discolored mucus, fever to 101 about 2 weeks ago now  He went to UC on 10/31- he was tested for flu, covid, strep- all negative CXR was negative per report  He was treated with steroids and cough medication as well as mucinex, sudafed   Temps are back to about normal the last week or so Nasal sx are better He is still coughing up some congestion His throat is still really sore    Pulse Readings from Last 3 Encounters:  12/19/23 85  10/16/23 78  04/03/23 95   Wt Readings from Last 3 Encounters:  12/19/23 282 lb 12.8 oz (128.3 kg)  10/16/23 290 lb 12.8 oz (131.9 kg)  04/03/23 291 lb (132 kg)     Patient Active Problem List   Diagnosis Date Noted   Dyslipidemia 09/25/2021   Uncontrolled type 2 diabetes mellitus with hyperglycemia (HCC) 09/21/2021    OSA on CPAP 01/03/2021   Severe obstructive sleep apnea-hypopnea syndrome 09/26/2020   Morbid obesity (HCC) 08/23/2020   Status post nasal surgery 08/23/2020    Past Medical History:  Diagnosis Date   Allergy    Asthma    Diabetes (HCC)    Esophagitis    Obesity    Sleep apnea     Past Surgical History:  Procedure Laterality Date   bilateral turbinnde reduction     SEPTOPLASTY      Social History   Tobacco Use   Smoking status: Never   Smokeless tobacco: Never  Vaping Use   Vaping status: Never Used  Substance Use Topics   Alcohol use: Yes    Comment: social   Drug use: Never    Family History  Problem Relation Age of Onset   Diabetes Mother    Diabetes Father    Irritable bowel syndrome Brother    Colon cancer Neg Hx    Stomach cancer Neg Hx    Esophageal cancer Neg Hx     No Known Allergies  Medication list has been reviewed and updated.  Current Outpatient Medications on File Prior to Visit  Medication Sig Dispense Refill   cetirizine (ZYRTEC) 10 MG tablet Take 10 mg by mouth daily.     fluticasone  (FLOVENT  HFA) 220 MCG/ACT  inhaler USE 2 INHALATIONS ORALLY   TWICE DAILY 24 g 2   metFORMIN  (GLUCOPHAGE -XR) 500 MG 24 hr tablet Take 2 tablets (1,000 mg total) by mouth daily with breakfast. 180 tablet 3   omeprazole  (PRILOSEC) 20 MG capsule Take 1 capsule (20 mg total) by mouth daily. 90 capsule 3   Semaglutide  (RYBELSUS ) 7 MG TABS Take 1 tablet (7 mg total) by mouth daily. 30 tablet 3   No current facility-administered medications on file prior to visit.    Review of Systems:  As per HPI- otherwise negative.   Physical Examination: Vitals:   12/19/23 1409 12/19/23 1438  BP: 108/68   Pulse: (!) 106 85  Temp: 98.2 F (36.8 C)   SpO2: 97%    Vitals:   12/19/23 1409  Weight: 282 lb 12.8 oz (128.3 kg)  Height: 6' 3.5 (1.918 m)   Body mass index is 34.88 kg/m. Ideal Body Weight: Weight in (lb) to have BMI = 25: 202.3  GEN: no acute  distress. HEENT: Atraumatic, Normocephalic.  Ears and Nose: No external deformity. CV: RRR, No M/G/R. No JVD. No thrill. No extra heart sounds. PULM: CTA B, no wheezes, crackles, rhonchi. No retractions. No resp. distress. No accessory muscle use. ABD: S, NT, ND, +BS. No rebound. No HSM. EXTR: No c/c/e PSYCH: Normally interactive. Conversant.   Results for orders placed or performed in visit on 12/19/23  POCT rapid strep A   Collection Time: 12/19/23  2:42 PM  Result Value Ref Range   Rapid Strep A Screen Positive (A) Negative    Assessment and Plan: Acute bronchitis, unspecified organism - Plan: azithromycin  (ZITHROMAX ) 250 MG tablet, HYDROcodone  bit-homatropine (HYCODAN) 5-1.5 MG/5ML syrup  Sore throat - Plan: POCT rapid strep A, HYDROcodone  bit-homatropine (HYCODAN) 5-1.5 MG/5ML syrup  Cough, unspecified type - Plan: HYDROcodone  bit-homatropine (HYCODAN) 5-1.5 MG/5ML syrup  Assessment & Plan Acute pharyngitis and acute bronchitis with cough Azithromycin  prescribed for broad coverage of respiratory infections. Hydrocodone  syrup for symptomatic relief with caution advised for drowsiness. - Azithromycin : 2 pills on day 1, then 1 pill daily for 4 days. - Hydrocodone  cough syrup prescribed with caution for drowsiness and use while driving or operating machinery. - Continue ibuprofen or acetaminophen as needed for pain. - Report if symptoms persist beyond 4-5 days.  Type 2 diabetes mellitus Rybelsus  effective with minimal nausea and aiding in weight control. Current weight 282 lbs, down from 291 lbs. - Continue Rybelsus  at current dosage.  Obesity (BMI 36.0-36.9) Weight decreased from 291 lbs to 282 lbs. Rybelsus  aiding in weight loss and appetite control. Exercise decreased due to illness. - Continue current management with Rybelsus .  Signed Harlene Schroeder, MD  - After patient left office is rapid strep turned positive.  I called patient and left message on machine, also sent  him a MyChart message.  Azithromycin  should treat his strep throat "

## 2023-12-19 ENCOUNTER — Ambulatory Visit: Admitting: Family Medicine

## 2023-12-19 ENCOUNTER — Encounter: Payer: Self-pay | Admitting: Family Medicine

## 2023-12-19 VITALS — BP 108/68 | HR 85 | Temp 98.2°F | Ht 75.5 in | Wt 282.8 lb

## 2023-12-19 DIAGNOSIS — J209 Acute bronchitis, unspecified: Secondary | ICD-10-CM

## 2023-12-19 DIAGNOSIS — R059 Cough, unspecified: Secondary | ICD-10-CM

## 2023-12-19 DIAGNOSIS — J029 Acute pharyngitis, unspecified: Secondary | ICD-10-CM | POA: Diagnosis not present

## 2023-12-19 LAB — POCT RAPID STREP A (OFFICE): Rapid Strep A Screen: POSITIVE — AB

## 2023-12-19 MED ORDER — HYDROCODONE BIT-HOMATROP MBR 5-1.5 MG/5ML PO SOLN: 5.0000 mL | Freq: Three times a day (TID) | ORAL | 0 refills | Status: AC | PRN

## 2023-12-19 MED ORDER — AZITHROMYCIN 250 MG PO TABS: ORAL_TABLET | ORAL | 0 refills | Status: AC

## 2023-12-20 ENCOUNTER — Other Ambulatory Visit (HOSPITAL_COMMUNITY): Payer: Self-pay

## 2024-01-23 ENCOUNTER — Ambulatory Visit: Admitting: Adult Health

## 2024-01-23 ENCOUNTER — Encounter: Payer: Self-pay | Admitting: Adult Health

## 2024-01-23 ENCOUNTER — Telehealth: Payer: Self-pay | Admitting: Adult Health

## 2024-01-23 ENCOUNTER — Ambulatory Visit: Payer: 59 | Admitting: Adult Health

## 2024-01-23 VITALS — BP 145/84 | HR 89 | Ht 75.0 in | Wt 282.8 lb

## 2024-01-23 DIAGNOSIS — G4733 Obstructive sleep apnea (adult) (pediatric): Secondary | ICD-10-CM

## 2024-01-23 NOTE — Patient Instructions (Signed)
 Your Plan:  Continue nightly use of CPAP with ensuring greater than 4 hours per night for optimal benefit and per insurance requirements  Continue to follow with your DME company [employer] [facility] for any needed supplies or CPAP related concerns     Follow up in 1 year or call earlier if needed     Thank you for coming to see us  at [facility]. I hope we have been able to provide you high quality care today.  You may receive a patient satisfaction survey over the next few weeks. We would appreciate your feedback and comments so that we may continue to improve ourselves and the health of our patients.

## 2024-01-23 NOTE — Telephone Encounter (Signed)
 Called pt and LVM reminding him to bring his cpap machine and power cord with him to his appt so that we can get a download.

## 2024-01-23 NOTE — Progress Notes (Signed)
 [facility] [address] [place]. [place] [address] [phone]       OFFICE FOLLOW UP NOTE  [name] Date of Birth:  26-Aug-1985 Medical Record Number:  [id number]   Reason for visit: CPAP follow-up    SUBJECTIVE:   CHIEF COMPLAINT:  Chief Complaint  Patient presents with   Follow-up    Patient in room 8 alone Cpap follow up, patient states no new issues or concerns at this time.  ESS score 4      Follow-up visit:  Prior visit: 01/23/2023  Brief HPI:   Jared Blake is a 38 y.o. male who is being followed for OSA on CPAP.  HST 09/2020 showed severe sleep apnea with total AHI 68.1/h and O2 nadir of 83%.  AutoPap initiated 10/2020 with pressure setting 6-18. DME [facility].  At prior visit, satisfactory compliance report with optimal residual AHI.  Continue current pressure settings.  ESS 8/24 (prior to CPAP 12/24).    Interval history:  Returns for CPAP follow up visit.  Patient continues to do well with CPAP therapy.  He continues to tolerate CPAP well and notes continued benefit. ESS 4/24.  Routinely follows with [facility] and up-to-date on supplies.  No specific questions or concerns at this time.        ROS:   14 system review of systems performed and negative with exception of those listed in HPI  PMH:  Past Medical History:  Diagnosis Date   Allergy    Asthma    Diabetes (HCC)    Esophagitis    Obesity    Sleep apnea     PSH:  Past Surgical History:  Procedure Laterality Date   bilateral turbinnde reduction     SEPTOPLASTY      Social History:  Social History   Socioeconomic History   Marital status: Married    Spouse name: Not on file   Number of children: 0   Years of education: Not on file   Highest education level: Bachelor's degree (e.g., BA, AB, BS)  Occupational History   Occupation: doctor, hospital  Tobacco Use   Smoking status: Never   Smokeless tobacco: Never  Vaping Use   Vaping  status: Never Used  Substance and Sexual Activity   Alcohol use: Yes    Comment: social   Drug use: Never   Sexual activity: Not on file  Other Topics Concern   Not on file  Social History Narrative   Patient does not live alone   Patient is employed full time.    Social Drivers of Health   Tobacco Use: Low Risk (01/23/2024)   Patient History    Smoking Tobacco Use: Never    Smokeless Tobacco Use: Never    Passive Exposure: Not on file  Financial Resource Strain: Low Risk (10/15/2023)   Overall Financial Resource Strain (CARDIA)    Difficulty of Paying Living Expenses: Not very hard  Food Insecurity: No Food Insecurity (10/15/2023)   Epic    Worried About Programme Researcher, Broadcasting/film/video in the Last Year: Never true    Ran Out of Food in the Last Year: Never true  Transportation Needs: No Transportation Needs (10/15/2023)   Epic    Lack of Transportation (Medical): No    Lack of Transportation (Non-Medical): No  Physical Activity: Insufficiently Active (10/15/2023)   Exercise Vital Sign    Days of Exercise per Week: 3 days    Minutes of Exercise per Session: 20 min  Stress: No Stress Concern Present (10/15/2023)   [employer] of Occupational Health - Occupational Stress Questionnaire    Feeling of Stress: Not at all  Social Connections: Moderately Isolated (10/15/2023)   Social Connection and Isolation Panel    Frequency of Communication with Friends and Family: Once a week    Frequency of Social Gatherings with Friends and Family: Three times a week    Attends Religious Services: Never    Active Member of Clubs or Organizations: No    Attends Banker Meetings: Not on file    Marital Status: Married  Catering Manager Violence: Not on file  Depression (PHQ2-9): Low Risk (12/19/2023)   Depression (PHQ2-9)    PHQ-2 Score: 0  Alcohol Screen: Low Risk (10/15/2023)   Alcohol Screen    Last Alcohol Screening Score (AUDIT): 3  Housing: Low Risk (10/15/2023)   Epic    Unable to Pay  for Housing in the Last Year: No    Number of Times Moved in the Last Year: 0    Homeless in the Last Year: No  Utilities: Not on file  Health Literacy: Not on file    Family History:  Family History  Problem Relation Age of Onset   Diabetes Mother    Diabetes Father    Irritable bowel syndrome Brother    Colon cancer Neg Hx    Stomach cancer Neg Hx    Esophageal cancer Neg Hx     Medications:   Current Outpatient Medications on File Prior to Visit  Medication Sig Dispense Refill   cetirizine (ZYRTEC) 10 MG tablet Take 10 mg by mouth daily.     fluticasone  (FLOVENT  HFA) 220 MCG/ACT inhaler USE 2 INHALATIONS ORALLY   TWICE DAILY 24 g 2   metFORMIN  (GLUCOPHAGE -XR) 500 MG 24 hr tablet Take 2 tablets (1,000 mg total) by mouth daily with breakfast. 180 tablet 3   omeprazole  (PRILOSEC) 20 MG capsule Take 1 capsule (20 mg total) by mouth daily. 90 capsule 3   Semaglutide  (RYBELSUS ) 7 MG TABS Take 1 tablet (7 mg total) by mouth daily. 30 tablet 3   No current facility-administered medications on file prior to visit.    Allergies:  No Known Allergies    OBJECTIVE:  Physical Exam  Vitals:   01/23/24 1011  BP: (!) 145/84  Pulse: 89  Weight: 282 lb 12.8 oz (128.3 kg)  Height: 6' 3 (1.905 m)    Body mass index is 35.35 kg/m. No results found.  General: well developed, well nourished, pleasant middle-age Caucasian male, seated, in no evident distress  Neurologic Exam Mental Status: Awake and fully alert. Oriented to place and time. Recent and remote memory intact. Attention span, concentration and fund of knowledge appropriate. Mood and affect appropriate.  Cranial Nerves: Extraocular movements full without nystagmus. Visual fields full to confrontation. Hearing intact. Facial sensation intact. Face, tongue, palate moves normally and symmetrically.  Motor: Normal bulk and tone. Normal strength in all tested extremity muscles Gait and Station: Arises from chair without  difficulty. Stance is normal. Gait demonstrates normal stride length and balance without use of AD.         ASSESSMENT/PLAN: Jared Blake is a 38 y.o. year old male    OSA on CPAP :  Compliance report shows excellent usage and optimal residual AHI.   Continue current pressure setting of 6-18.  Advised patient to contact [employer] [facility] regarding headgear concerns and possibly getting replacements more frequently.   Continue nightly use  of CPAP for adequate sleep apnea management.   Continue to follow with DME [facility] for any needed supplies or CPAP related concerns. AutoPap set up 10/2020    Follow up in 1 year or call earlier if needed   CC:  PCP: Copland, Harlene BROCKS, MD    Harlene Bogaert, AGNP-BC  [facility] [address] [place], [place] [address]  Phone [phone] Fax [phone] Note: This document was prepared with digital dictation and possible smart phrase technology. Any transcriptional errors that result from this process are unintentional.

## 2024-01-23 NOTE — Telephone Encounter (Signed)
 Noted

## 2024-03-09 ENCOUNTER — Encounter: Payer: Self-pay | Admitting: Family Medicine

## 2024-03-09 DIAGNOSIS — R7303 Prediabetes: Secondary | ICD-10-CM

## 2024-03-09 DIAGNOSIS — E1165 Type 2 diabetes mellitus with hyperglycemia: Secondary | ICD-10-CM

## 2024-03-09 MED ORDER — METFORMIN HCL ER 500 MG PO TB24: 1000.0000 mg | ORAL_TABLET | Freq: Every day | ORAL | 0 refills | Status: AC

## 2024-03-09 MED ORDER — RYBELSUS 7 MG PO TABS: 7.0000 mg | ORAL_TABLET | Freq: Every day | ORAL | 0 refills | Status: AC

## 2025-01-25 ENCOUNTER — Ambulatory Visit: Admitting: Adult Health
